# Patient Record
Sex: Female | Born: 1990 | Race: White | Hispanic: No | Marital: Single | Smoking: Current every day smoker
Health system: Southern US, Community
[De-identification: ages and names within clinical notes are randomized; demographics above are authoritative.]

## PROBLEM LIST (undated history)

## (undated) DIAGNOSIS — N390 Urinary tract infection, site not specified: Secondary | ICD-10-CM

## (undated) DIAGNOSIS — N200 Calculus of kidney: Secondary | ICD-10-CM

## (undated) DIAGNOSIS — I10 Essential (primary) hypertension: Secondary | ICD-10-CM

## (undated) HISTORY — PX: CHOLECYSTECTOMY: SHX55

## (undated) HISTORY — PX: LITHOTRIPSY: SUR834

---

## 2017-09-16 ENCOUNTER — Emergency Department
Admission: EM | Admit: 2017-09-16 | Discharge: 2017-09-16 | Disposition: A | Discharge status: Home / Self Care | Payer: Medicaid Other | Attending: Emergency Medicine | Admitting: Emergency Medicine

## 2017-09-16 ENCOUNTER — Emergency Department: Payer: Medicaid Other

## 2017-09-16 ENCOUNTER — Encounter: Payer: Self-pay | Admitting: Emergency Medicine

## 2017-09-16 DIAGNOSIS — I10 Essential (primary) hypertension: Secondary | ICD-10-CM | POA: Insufficient documentation

## 2017-09-16 DIAGNOSIS — B9789 Other viral agents as the cause of diseases classified elsewhere: Secondary | ICD-10-CM | POA: Insufficient documentation

## 2017-09-16 DIAGNOSIS — F17200 Nicotine dependence, unspecified, uncomplicated: Secondary | ICD-10-CM | POA: Diagnosis not present

## 2017-09-16 DIAGNOSIS — R05 Cough: Secondary | ICD-10-CM | POA: Diagnosis present

## 2017-09-16 DIAGNOSIS — J069 Acute upper respiratory infection, unspecified: Secondary | ICD-10-CM

## 2017-09-16 HISTORY — DX: Essential (primary) hypertension: I10

## 2017-09-16 MED ORDER — PSEUDOEPH-BROMPHEN-DM 30-2-10 MG/5ML PO SYRP: 5.0000 mL | ORAL_SOLUTION | Freq: Four times a day (QID) | ORAL | 0 refills | Status: DC | PRN

## 2017-09-16 NOTE — ED Provider Notes (Signed)
[facility] Emergency Department Provider Note   ____________________________________________   First MD Initiated Contact with Patient 09/16/17 1217     (approximate)  I have reviewed the triage vital signs and the nursing notes.   HISTORY  Chief Complaint Cough    HPI Mary Gilbert is a 26 y.o. female patient presents with productive cough for 3 weeks. Patient has fever with this complaint.There is nasal congestion which increases in the evening. Patient stated there is nausea but no vomiting. Patient states today she is nose bilateral ear pressure. Denies hearing loss. No palliative measures for complaint.   Past Medical History:  Diagnosis Date  . Hypertension     There are no active problems to display for this patient.   History reviewed. No pertinent surgical history.  Prior to Admission medications   Medication Sig Start Date End Date Taking? Authorizing Provider  lisdexamfetamine (VYVANSE) 20 MG capsule Take 20 mg by mouth daily.   Yes [provider]  brompheniramine-pseudoephedrine-DM 30-2-10 MG/5ML syrup Take 5 mLs by mouth 4 (four) times daily as needed. 09/16/17   Joni Reining, PA-C    Allergies Patient has no known allergies.  No family history on file.  Social History Social History  Substance Use Topics  . Smoking status: Current Every Day Smoker  . Smokeless tobacco: Never Used  . Alcohol use No    Review of Systems  Constitutional: No fever/chills Eyes: No visual changes. ENT: Nasal congestion and postnasal drainage  Cardiovascular: Denies chest pain. Respiratory: Duct a cough and shortness of breath. Gastrointestinal: No abdominal pain.  Nausea without vomiting.  No diarrhea.  No constipation. Genitourinary: Negative for dysuria. Musculoskeletal: Negative for back pain. Skin: Negative for rash. Neurological: Negative for headaches, focal weakness or  numbness. Psychiatric:ADHD Endocrine:Hypertension ____________________________________________   PHYSICAL EXAM:  VITAL SIGNS: ED Triage Vitals  Enc Vitals Group     BP 09/16/17 1211 124/82     Pulse Rate 09/16/17 1211 91     Resp 09/16/17 1211 20     Temp 09/16/17 1211 98.7 F (37.1 C)     Temp Source 09/16/17 1211 Oral     SpO2 09/16/17 1211 99 %     Weight 09/16/17 1210 260 lb (117.9 kg)     Height 09/16/17 1210  (1.651 m)     Head Circumference --      Peak Flow --      Pain Score --      Pain Loc --      Pain Edu? --      Excl. in GC? --     Constitutional: Alert and oriented. Well appearing and in no acute distress. Eyes: Conjunctivae are normal. PERRL. EOMI. Head: Atraumatic. Nose: Edematous nasal turbinates clear rhinorrhea Mouth/Throat: Mucous membranes are moist.  Oropharynx non-erythematous. Postnasal drainage Neck: No stridor.   Hematological/Lymphatic/Immunilogical: No cervical lymphadenopathy. Cardiovascular: Normal rate, regular rhythm. Grossly normal heart sounds.  Good peripheral circulation. Respiratory: Normal respiratory effort.  No retractions. Lungs CTAB. Gastrointestinal: Soft and nontender. No distention. No abdominal bruits. No CVA tenderness. Genitourinary: Deferred Musculoskeletal: No lower extremity tenderness nor edema.  No joint effusions. Neurologic:  Normal speech and language. No gross focal neurologic deficits are appreciated. No gait instability. Skin:  Skin is warm, dry and intact. No rash noted. Psychiatric: Mood and affect are normal. Speech and behavior are normal.  ____________________________________________   LABS (all labs ordered are listed, but only abnormal results are displayed)  Labs Reviewed -  No data to display ____________________________________________  EKG   ____________________________________________  RADIOLOGY  Dg Chest 2 View  Result Date: 09/16/2017 CLINICAL DATA:  Three-week history of  cough. EXAM: CHEST  2 VIEW COMPARISON:  None. FINDINGS: The heart size and mediastinal contours are within normal limits. Both lungs are clear. The visualized skeletal structures are unremarkable. IMPRESSION: No active cardiopulmonary disease. Electronically Signed   By: Kennith Center M.D.   On: 09/16/2017 12:51    __Acute findings on chest x-ray x-rays __________________________________________   PROCEDURES  Procedure(s) performed: None  Procedures  Critical Care performed: No  ____________________________________________   INITIAL IMPRESSION / ASSESSMENT AND PLAN / ED COURSE  Pertinent labs & imaging results that were available during my care of the patient were reviewed by me and considered in my medical decision making (see chart for details).  Cough secondary to upper respiratory infection. Discussed negative x-ray findings with patient. Patient given discharge care instructions. Patient advised to follow-up with the [facility] if condition persists.      ____________________________________________   FINAL CLINICAL IMPRESSION(S) / ED DIAGNOSES  Final diagnoses:  Viral URI with cough      NEW MEDICATIONS STARTED DURING THIS VISIT:  New Prescriptions   BROMPHENIRAMINE-PSEUDOEPHEDRINE-DM 30-2-10 MG/5ML SYRUP    Take 5 mLs by mouth 4 (four) times daily as needed.     Note:  This document was prepared using Dragon voice recognition software and may include unintentional dictation errors.    Joni Reining, PA-C 09/16/17 1311    Jene Every, MD 09/16/17 [phone]

## 2017-09-16 NOTE — ED Triage Notes (Signed)
presents with cough for about 3 weeks  Denies any fever  States she has had some nasal congestion with some diff breath  Esp at night

## 2017-10-02 ENCOUNTER — Emergency Department
Admission: EM | Admit: 2017-10-02 | Discharge: 2017-10-02 | Disposition: A | Discharge status: Home / Self Care | Payer: Medicaid Other | Attending: Emergency Medicine | Admitting: Emergency Medicine

## 2017-10-02 ENCOUNTER — Encounter: Payer: Self-pay | Admitting: Emergency Medicine

## 2017-10-02 ENCOUNTER — Emergency Department: Payer: Medicaid Other

## 2017-10-02 DIAGNOSIS — I10 Essential (primary) hypertension: Secondary | ICD-10-CM | POA: Diagnosis not present

## 2017-10-02 DIAGNOSIS — Y929 Unspecified place or not applicable: Secondary | ICD-10-CM | POA: Diagnosis not present

## 2017-10-02 DIAGNOSIS — S82892A Other fracture of left lower leg, initial encounter for closed fracture: Secondary | ICD-10-CM

## 2017-10-02 DIAGNOSIS — T148XXA Other injury of unspecified body region, initial encounter: Secondary | ICD-10-CM | POA: Diagnosis not present

## 2017-10-02 DIAGNOSIS — Y999 Unspecified external cause status: Secondary | ICD-10-CM | POA: Insufficient documentation

## 2017-10-02 DIAGNOSIS — F17210 Nicotine dependence, cigarettes, uncomplicated: Secondary | ICD-10-CM | POA: Insufficient documentation

## 2017-10-02 DIAGNOSIS — W19XXXA Unspecified fall, initial encounter: Secondary | ICD-10-CM | POA: Diagnosis not present

## 2017-10-02 DIAGNOSIS — S99912A Unspecified injury of left ankle, initial encounter: Secondary | ICD-10-CM | POA: Diagnosis present

## 2017-10-02 DIAGNOSIS — Y939 Activity, unspecified: Secondary | ICD-10-CM | POA: Diagnosis not present

## 2017-10-02 MED ORDER — TRAMADOL HCL 50 MG PO TABS: 50.0000 mg | ORAL_TABLET | Freq: Four times a day (QID) | ORAL | 0 refills | Status: DC | PRN

## 2017-10-02 MED ORDER — MELOXICAM 15 MG PO TABS: 15.0000 mg | ORAL_TABLET | Freq: Every day | ORAL | 2 refills | Status: DC

## 2017-10-02 NOTE — Discharge Instructions (Signed)
Keep foot and ankle elevated as much as possible.  Apply ice at least 3 times a day for 5 days.  Call orthopedics for appointment.

## 2017-10-02 NOTE — ED Triage Notes (Signed)
Twisted L ankle this am, painful.

## 2017-10-02 NOTE — ED Provider Notes (Signed)
[facility] Emergency Department Provider Note  ____________________________________________   First MD Initiated Contact with Patient 10/02/17 [phone]     (approximate)  I have reviewed the triage vital signs and the nursing notes.   HISTORY  Chief Complaint Ankle Pain    HPI Kerra Guilfoil is a 26 y.o. female complains of a fall this morning where she twisted her ankle. It hurts to bear weight and the ankle is swollen. Denies any numbness or tingling. denies any loss of motion. Patient also has abrasions on her arms and legs from the fall. Denies neck pain, loss of consciousness, abdominal pain or chest pain. States her tdap is utd.  Patient states she is also on lisinopril and another blood pressure medication along with a new birth control pill. Does not know the name of her medications.   Past Medical History:  Diagnosis Date  . Hypertension     There are no active problems to display for this patient.   History reviewed. No pertinent surgical history.  Prior to Admission medications   Medication Sig Start Date End Date Taking? Authorizing Provider  meloxicam (MOBIC) 15 MG tablet Take 1 tablet (15 mg total) by mouth daily. 10/02/17 10/02/18  Fisher, Roselyn Bering, PA-C  traMADol (ULTRAM) 50 MG tablet Take 1 tablet (50 mg total) by mouth every 6 (six) hours as needed. 10/02/17 10/02/18  Faythe Ghee, PA-C    Allergies Patient has no known allergies.  No family history on file.  Social History Social History  Substance Use Topics  . Smoking status: Current Every Day Smoker    Packs/day: 0.50    Types: Cigarettes  . Smokeless tobacco: Never Used  . Alcohol use No    Review of Systems  Constitutional: No fever/chills, no headache Eyes: No visual changes. ENT: No sore throat. No damage to teeth or face. Respiratory: Denies cough Gastrointestinal: Denies abdominal pain Musculoskeletal: Negative for back pain. Negative for neck pain positive  for left ankle pain Skin: Positive for abrasions    ____________________________________________   PHYSICAL EXAM:  VITAL SIGNS: ED Triage Vitals  Enc Vitals Group     BP 10/02/17 0847 122/73     Pulse Rate 10/02/17 0847 94     Resp 10/02/17 0847 20     Temp 10/02/17 0847 97.8 F (36.6 C)     Temp Source 10/02/17 0847 Oral     SpO2 10/02/17 0847 100 %     Weight 10/02/17 0849 270 lb (122.5 kg)     Height 10/02/17 0849 (!) 5" (0.127 m)     Head Circumference --      Peak Flow --      Pain Score 10/02/17 0846 10     Pain Loc --      Pain Edu? --      Excl. in GC? --    Constitutional: Alert and oriented. Well appearing and in no acute distress. Eyes: Conjunctivae are normal.  Head: Atraumatic. Nose: No congestion/rhinnorhea. Mouth/Throat: Mucous membranes are moist.   Cardiovascular: Normal rate, regular rhythm. Respiratory: Normal respiratory effort.  No retractions GU: deferred Musculoskeletal: FROM all extremities, warm and well perfused. Left ankle is swollen and tender at the lateral malleolus. No bruising noted. Foot has full range of motion. Ankle has full range of motion. Skin is intact across the ankle. Right elbow and bilateral wrist are negative for bony tenderness does have full range of motion. Neurologic:  Normal speech and language.  Skin:  Skin  is warm, dry and intact. Abrasions on the right elbow. Abrasions on the right hip. Psychiatric: Mood and affect are normal. Speech and behavior are normal.  ____________________________________________   LABS (all labs ordered are listed, but only abnormal results are displayed)  Labs Reviewed - No data to display ____________________________________________   ____________________________________________  RADIOLOGY Sliver fracture of the talus of the left ankle  ____________________________________________   PROCEDURES  Procedure(s)  performed:      ____________________________________________   INITIAL IMPRESSION / ASSESSMENT AND PLAN / ED COURSE  Pertinent labs & imaging results that were available during my care of the patient were reviewed by me and considered in my medical decision making (see chart for details).  Pt is 26 year old white female with left ankle pain. x-ray shows sliver fracture of the talus. Patient has multiple abrasions on the arms legs and knees. No other bony tenderness was noted.  Patient appears well and stable. Discussed x-ray with patient and her mother. Stirrup splint and crutches were ordered. Prescription for meloxicam and tramadol prescribed. Patient was instructed to elevate the ankle and keep ice on the area that is swollen. Was instructed to call orthopedics for a follow-up appointment. Patient states she understands and will call the orthopedic physician.      ____________________________________________   FINAL CLINICAL IMPRESSION(S) / ED DIAGNOSES  Final diagnoses:  Closed fracture of left ankle, initial encounter  Abrasion      NEW MEDICATIONS STARTED DURING THIS VISIT:  New Prescriptions   MELOXICAM (MOBIC) 15 MG TABLET    Take 1 tablet (15 mg total) by mouth daily.   TRAMADOL (ULTRAM) 50 MG TABLET    Take 1 tablet (50 mg total) by mouth every 6 (six) hours as needed.     Note:  This document was prepared using Dragon voice recognition software and may include unintentional dictation errors.    Faythe Ghee, PA-C 10/02/17 1033    Jene Every, MD 10/02/17 [phone]

## 2017-12-11 ENCOUNTER — Emergency Department
Admission: EM | Admit: 2017-12-11 | Discharge: 2017-12-11 | Disposition: A | Discharge status: Home / Self Care | Payer: Medicaid Other | Attending: Emergency Medicine | Admitting: Emergency Medicine

## 2017-12-11 ENCOUNTER — Encounter: Payer: Self-pay | Admitting: Emergency Medicine

## 2017-12-11 ENCOUNTER — Other Ambulatory Visit: Payer: Self-pay

## 2017-12-11 DIAGNOSIS — N611 Abscess of the breast and nipple: Secondary | ICD-10-CM | POA: Diagnosis not present

## 2017-12-11 DIAGNOSIS — F17210 Nicotine dependence, cigarettes, uncomplicated: Secondary | ICD-10-CM | POA: Insufficient documentation

## 2017-12-11 DIAGNOSIS — N6310 Unspecified lump in the right breast, unspecified quadrant: Secondary | ICD-10-CM | POA: Diagnosis present

## 2017-12-11 DIAGNOSIS — I10 Essential (primary) hypertension: Secondary | ICD-10-CM | POA: Insufficient documentation

## 2017-12-11 DIAGNOSIS — L0291 Cutaneous abscess, unspecified: Secondary | ICD-10-CM

## 2017-12-11 MED ORDER — MUPIROCIN CALCIUM 2 % EX CREA: 1.0000 "application " | TOPICAL_CREAM | Freq: Two times a day (BID) | CUTANEOUS | 0 refills | Status: DC

## 2017-12-11 MED ORDER — SULFAMETHOXAZOLE-TRIMETHOPRIM 800-160 MG PO TABS: 1.0000 | ORAL_TABLET | Freq: Two times a day (BID) | ORAL | 0 refills | Status: DC

## 2017-12-11 NOTE — ED Notes (Signed)
Pt ambulatory upon discharge. Verbalized understanding of discharge instructions, follow-up care and prescriptions. This RN also educated pt re: importance of taking entire course of antibiotics and to use back-up birth control while on antibiotics. A&O x4. Skin warm and dry. VSS.

## 2017-12-11 NOTE — Discharge Instructions (Signed)
Apply warm compress to the area, use medication as prescribed, you have been given an oral antibiotic and an ointment to apply to the area, if the redness is spreading and you develop a fever please return to the emergency department,  if you just or not improving in the next few days follow-up with your regular doctor

## 2017-12-11 NOTE — ED Triage Notes (Signed)
Possible insect bite R breast noted 2 weeks ago. Cont as small sore. Noted clear drainage today.

## 2017-12-11 NOTE — ED Provider Notes (Signed)
[facility] Emergency Department Provider Note  ____________________________________________   First MD Initiated Contact with Patient 12/11/17 2018     (approximate)  I have reviewed the triage vital signs and the nursing notes.   HISTORY  Chief Complaint Insect Bite    HPI Heath GoldHeather Cinnamon is a 26 y.o. female complains of a red swollen area to the right breast, states it has been there for couple of weeks, noticed that it started draining today, she denies fever chills, she denies any other problems at this time  Past Medical History:  Diagnosis Date  . Hypertension     There are no active problems to display for this patient.   History reviewed. No pertinent surgical history.  Prior to Admission medications   Medication Sig Start Date End Date Taking? Authorizing Provider  meloxicam (MOBIC) 15 MG tablet Take 1 tablet (15 mg total) by mouth daily. 10/02/17 10/02/18  Jaquavion Mccannon, Roselyn BeringSusan W, PA-C  mupirocin cream (BACTROBAN) 2 % Apply 1 application topically 2 (two) times daily. 12/11/17   Kaneisha Ellenberger, Roselyn BeringSusan W, PA-C  sulfamethoxazole-trimethoprim (BACTRIM DS,SEPTRA DS) 800-160 MG tablet Take 1 tablet by mouth 2 (two) times daily. 12/11/17   Faythe GheeFisher, Atara Paterson W, PA-C    Allergies Patient has no known allergies.  No family history on file.  Social History Social History   Tobacco Use  . Smoking status: Current Every Day Smoker    Packs/day: 0.50    Types: Cigarettes  . Smokeless tobacco: Never Used  Substance Use Topics  . Alcohol use: No  . Drug use: No    Review of Systems  Constitutional: No fever/chills Eyes: No visual changes. ENT: No sore throat. Respiratory: Denies cough Genitourinary: Negative for dysuria. Musculoskeletal: Negative for back pain. Skin: Positive for a red area on the right breast    ____________________________________________   PHYSICAL EXAM:  VITAL SIGNS: ED Triage Vitals  Enc Vitals Group     BP 12/11/17 1901  (!) 162/104     Pulse Rate 12/11/17 1901 93     Resp 12/11/17 1901 20     Temp 12/11/17 1901 98.2 F (36.8 C)     Temp Source 12/11/17 1901 Oral     SpO2 12/11/17 1901 98 %     Weight 12/11/17 1902 260 lb (117.9 kg)     Height 12/11/17 1902 5\' 7"  (1.702 m)     Head Circumference --      Peak Flow --      Pain Score --      Pain Loc --      Pain Edu? --      Excl. in GC? --     Constitutional: Alert and oriented. Well appearing and in no acute distress. Eyes: Conjunctivae are normal.  Head: Atraumatic. Nose: No congestion/rhinnorhea. Mouth/Throat: Mucous membranes are moist.   Cardiovascular: Normal rate, regular rhythm.  Heart sounds are normal Respiratory: Normal respiratory effort.  No retractions, lungs are clear to auscultation GU: deferred Musculoskeletal: FROM all extremities, warm and well perfused Neurologic:  Normal speech and language.  Skin:  Skin is warm, dry , small abscess that is already draining to the right breast above the nipple, the area is minimally tender, no induration noted, neurovascular is intact  psychiatric: Mood and affect are normal. Speech and behavior are normal.  ____________________________________________   LABS (all labs ordered are listed, but only abnormal results are displayed)  Labs Reviewed - No data to display ____________________________________________   ____________________________________________  RADIOLOGY  ____________________________________________   PROCEDURES  Procedure(s) performed: No      ____________________________________________   INITIAL IMPRESSION / ASSESSMENT AND PLAN / ED COURSE  Pertinent labs & imaging results that were available during my care of the patient were reviewed by me and considered in my medical decision making (see chart for details).  Patient is a 26 year old female with a next to the right breast that is already draining, the area is not indurated, a prescription for  Bactroban ointment and Septra DS 1 p.o. twice daily was given, patient is to apply warm compress to the area, she is to follow-up with her regular doctor if not better in 3-5 days, she is to return to the emergency department if worsening, she was discharged in stable condition      ____________________________________________   FINAL CLINICAL IMPRESSION(S) / ED DIAGNOSES  Final diagnoses:  Abscess      NEW MEDICATIONS STARTED DURING THIS VISIT:  This SmartLink is deprecated. Use AVSMEDLIST instead to display the medication list for a patient.   Note:  This document was prepared using Dragon voice recognition software and may include unintentional dictation errors.    Faythe GheeFisher, Hurley Sobel W, PA-C 12/11/17 2024    Jeanmarie PlantMcShane, James A, MD 12/11/17 [phone]

## 2017-12-27 ENCOUNTER — Other Ambulatory Visit: Payer: Self-pay

## 2017-12-27 ENCOUNTER — Emergency Department
Admission: EM | Admit: 2017-12-27 | Discharge: 2017-12-27 | Disposition: A | Discharge status: Home / Self Care | Payer: Medicaid Other | Attending: Emergency Medicine | Admitting: Emergency Medicine

## 2017-12-27 DIAGNOSIS — I10 Essential (primary) hypertension: Secondary | ICD-10-CM | POA: Diagnosis not present

## 2017-12-27 DIAGNOSIS — Z791 Long term (current) use of non-steroidal anti-inflammatories (NSAID): Secondary | ICD-10-CM | POA: Diagnosis not present

## 2017-12-27 DIAGNOSIS — F17210 Nicotine dependence, cigarettes, uncomplicated: Secondary | ICD-10-CM | POA: Diagnosis not present

## 2017-12-27 DIAGNOSIS — N760 Acute vaginitis: Secondary | ICD-10-CM | POA: Insufficient documentation

## 2017-12-27 DIAGNOSIS — N390 Urinary tract infection, site not specified: Secondary | ICD-10-CM | POA: Insufficient documentation

## 2017-12-27 DIAGNOSIS — R102 Pelvic and perineal pain: Secondary | ICD-10-CM | POA: Diagnosis present

## 2017-12-27 DIAGNOSIS — B9689 Other specified bacterial agents as the cause of diseases classified elsewhere: Secondary | ICD-10-CM

## 2017-12-27 HISTORY — DX: Urinary tract infection, site not specified: N39.0

## 2017-12-27 HISTORY — DX: Calculus of kidney: N20.0

## 2017-12-27 LAB — URINALYSIS, COMPLETE (UACMP) WITH MICROSCOPIC
Bacteria, UA: NONE SEEN
Bilirubin Urine: NEGATIVE
GLUCOSE, UA: NEGATIVE mg/dL
Hgb urine dipstick: NEGATIVE
KETONES UR: NEGATIVE mg/dL
NITRITE: NEGATIVE
PH: 6 (ref 5.0–8.0)
Protein, ur: NEGATIVE mg/dL
Specific Gravity, Urine: 1.02 (ref 1.005–1.030)

## 2017-12-27 LAB — CHLAMYDIA/NGC RT PCR (ARMC ONLY)
Chlamydia Tr: NOT DETECTED
N GONORRHOEAE: NOT DETECTED

## 2017-12-27 LAB — WET PREP, GENITAL
CLUE CELLS WET PREP: NONE SEEN
SPERM: NONE SEEN
Trich, Wet Prep: NONE SEEN
Yeast Wet Prep HPF POC: NONE SEEN

## 2017-12-27 MED ORDER — IBUPROFEN 600 MG PO TABS
600.0000 mg | ORAL_TABLET | Freq: Once | ORAL | Status: AC
  Administered 2017-12-27: 600 mg via ORAL
  Filled 2017-12-27: qty 1

## 2017-12-27 MED ORDER — METRONIDAZOLE 500 MG PO TABS: 500.0000 mg | ORAL_TABLET | Freq: Two times a day (BID) | ORAL | 0 refills | Status: AC

## 2017-12-27 MED ORDER — CIPROFLOXACIN HCL 500 MG PO TABS: 500.0000 mg | ORAL_TABLET | Freq: Two times a day (BID) | ORAL | 0 refills | Status: AC

## 2017-12-27 NOTE — ED Provider Notes (Signed)
[facility] Emergency Department Provider Note ____________________________________________   First MD Initiated Contact with Patient 12/27/17 [phone]     (approximate)  I have reviewed the triage vital signs and the nursing notes.   HISTORY  Chief Complaint Pelvic Pain    HPI Mary Gilbert is a 26 y.o. female with past medical history as noted below who presents with vaginal pain for the last several days, gradual onset, associated with brownish vaginal discharge, and similar to symptoms she has had in the past associated with bacterial vaginosis.  The patient states she is sexually active with one partner.  She denies vaginal bleeding, urinary symptoms, back pain, abdominal pain, or fever or chills.   Past Medical History:  Diagnosis Date  . Hypertension   . Kidney stones   . UTI (urinary tract infection)     There are no active problems to display for this patient.   Past Surgical History:  Procedure Laterality Date  . CHOLECYSTECTOMY    . LITHOTRIPSY      Prior to Admission medications   Medication Sig Start Date End Date Taking? Authorizing Provider  ciprofloxacin (CIPRO) 500 MG tablet Take 1 tablet (500 mg total) by mouth 2 (two) times daily for 3 days. 12/27/17 12/30/17  Dionne BucySiadecki, Brodyn Depuy, MD  meloxicam (MOBIC) 15 MG tablet Take 1 tablet (15 mg total) by mouth daily. 10/02/17 10/02/18  Fisher, Roselyn BeringSusan W, PA-C  metroNIDAZOLE (FLAGYL) 500 MG tablet Take 1 tablet (500 mg total) by mouth 2 (two) times daily for 7 days. 12/27/17 01/03/18  Dionne BucySiadecki, Kahlea Cobert, MD  mupirocin cream (BACTROBAN) 2 % Apply 1 application topically 2 (two) times daily. 12/11/17   Fisher, Roselyn BeringSusan W, PA-C  sulfamethoxazole-trimethoprim (BACTRIM DS,SEPTRA DS) 800-160 MG tablet Take 1 tablet by mouth 2 (two) times daily. 12/11/17   Faythe GheeFisher, Susan W, PA-C    Allergies Patient has no known allergies.  No family history on file.  Social History Social History   Tobacco Use  .  Smoking status: Current Every Day Smoker    Packs/day: 0.50    Types: Cigarettes  . Smokeless tobacco: Never Used  Substance Use Topics  . Alcohol use: No  . Drug use: No    Review of Systems  Constitutional: No fever/chills. Eyes: No redness. ENT: No sore throat. Cardiovascular: Denies chest pain. Respiratory: Denies shortness of breath. Gastrointestinal: No nausea, no vomiting.  No diarrhea.  Genitourinary: Negative for dysuria.  Musculoskeletal: Negative for back pain. Skin: Negative for rash. Neurological: Negative for headache.   ____________________________________________   PHYSICAL EXAM:  VITAL SIGNS: ED Triage Vitals  Enc Vitals Group     BP 12/27/17 0831 135/80     Pulse Rate 12/27/17 0831 86     Resp 12/27/17 0831 17     Temp 12/27/17 0831 97.8 F (36.6 C)     Temp Source 12/27/17 0831 Oral     SpO2 12/27/17 0831 98 %     Weight 12/27/17 0832 260 lb (117.9 kg)     Height 12/27/17 0832 5\' 7"  (1.702 m)     Head Circumference --      Peak Flow --      Pain Score 12/27/17 0831 5     Pain Loc --      Pain Edu? --      Excl. in GC? --     Constitutional: Alert and oriented. Well appearing and in no acute distress. Eyes: Conjunctivae are normal.  Head: Atraumatic. Nose: No congestion/rhinnorhea. Mouth/Throat: Mucous  membranes are moist.   Neck: Normal range of motion.  Cardiovascular: Good peripheral circulation. Respiratory: Normal respiratory effort.   Gastrointestinal: Soft and nontender. No distention.  Genitourinary: No flank tenderness.  Normal external genitalia.  Small amount of green/brown vaginal discharge in the vault.  No CMT or adnexal tenderness. Musculoskeletal: Extremities warm and well perfused.  Neurologic:  Normal speech and language. No gross focal neurologic deficits are appreciated.  Skin:  Skin is warm and dry. No rash noted. Psychiatric: Mood and affect are normal. Speech and behavior are  normal.  ____________________________________________   LABS (all labs ordered are listed, but only abnormal results are displayed)  Labs Reviewed  WET PREP, GENITAL - Abnormal; Notable for the following components:      Result Value   WBC, Wet Prep HPF POC FEW (*)    All other components within normal limits  URINALYSIS, COMPLETE (UACMP) WITH MICROSCOPIC - Abnormal; Notable for the following components:   Color, Urine YELLOW (*)    APPearance CLOUDY (*)    Leukocytes, UA MODERATE (*)    Squamous Epithelial / LPF 0-5 (*)    All other components within normal limits  CHLAMYDIA/NGC RT PCR ([facility] ONLY)   ____________________________________________  EKG   ____________________________________________  RADIOLOGY    ____________________________________________   PROCEDURES  Procedure(s) performed: No    Critical Care performed: No ____________________________________________   INITIAL IMPRESSION / ASSESSMENT AND PLAN / ED COURSE  Pertinent labs & imaging results that were available during my care of the patient were reviewed by me and considered in my medical decision making (see chart for details).  26 year old female with past medical history as noted above presents with several days of vaginal area pain and discharge, with no bleeding or dysuria.  No fevers or abdominal pain.  Past medical records reviewed in epic and are noncontributory.  On exam, patient is well-appearing, vital signs are normal, and the remainder of the exam is as described above with small amount of vaginal discharge but no other significant findings on pelvic.  Differential includes BV, yeast, UTI, or other nonemergent causes of pelvic pain such as ovarian cyst or mittelschmerz.  There is no clinical evidence for PID.  I will treat based on results of UA and wet prep.    ----------------------------------------- 10:25 AM on 12/27/2017 -----------------------------------------  UA  is consistent with possible UTI, but given the presence of the discharge I will also treat for BV.  I will give a 3-day course of Cipro and a 7-day course of Flagyl to treat both these.  Return precautions and discharge instructions explained to patient, and she expresses understanding.  She states she does not have a doctor here and does not immediately plan to get one as she is not possibly staying in the area, and [place] in [place].  She was instructed to return to the emergency department for new, worsening, or persistent symptoms.  ____________________________________________   FINAL CLINICAL IMPRESSION(S) / ED DIAGNOSES  Final diagnoses:  Urinary tract infection without hematuria, site unspecified  Bacterial vaginitis      NEW MEDICATIONS STARTED DURING THIS VISIT:  This SmartLink is deprecated. Use AVSMEDLIST instead to display the medication list for a patient.   Note:  This document was prepared using Dragon voice recognition software and may include unintentional dictation errors.    Dionne BucySiadecki, Rylynn Kobs, MD 12/27/17 1026

## 2017-12-27 NOTE — Discharge Instructions (Signed)
Take both antibiotics as prescribed and finish the full course.  Return to the emergency department for new, worsening, or persistent pain, discharge, bleeding, back or flank pain, weakness, fever chills, or any other new or worsening symptoms that concern you.

## 2017-12-27 NOTE — ED Triage Notes (Signed)
Pt c/o pelvic pain with painful urination and vaginal discharge since Friday/. Pt states she has a hx of bacterial vaginosis..Marland Kitchen

## 2018-01-07 ENCOUNTER — Other Ambulatory Visit: Payer: Self-pay

## 2018-01-07 ENCOUNTER — Emergency Department
Admission: EM | Admit: 2018-01-07 | Discharge: 2018-01-07 | Disposition: A | Discharge status: Home / Self Care | Payer: Medicaid Other | Attending: Emergency Medicine | Admitting: Emergency Medicine

## 2018-01-07 DIAGNOSIS — F17210 Nicotine dependence, cigarettes, uncomplicated: Secondary | ICD-10-CM | POA: Insufficient documentation

## 2018-01-07 DIAGNOSIS — J029 Acute pharyngitis, unspecified: Secondary | ICD-10-CM

## 2018-01-07 DIAGNOSIS — I10 Essential (primary) hypertension: Secondary | ICD-10-CM | POA: Insufficient documentation

## 2018-01-07 LAB — GROUP A STREP BY PCR: GROUP A STREP BY PCR: NOT DETECTED

## 2018-01-07 MED ORDER — PSEUDOEPH-BROMPHEN-DM 30-2-10 MG/5ML PO SYRP: 5.0000 mL | ORAL_SOLUTION | Freq: Four times a day (QID) | ORAL | 0 refills | Status: DC | PRN

## 2018-01-07 NOTE — Discharge Instructions (Signed)
Follow-up with your primary care doctor in [place] if any continued problems.  Tylenol or ibuprofen as needed for throat pain, bodyaches or fever.  Increase fluids.  Take Bromfed-DM as needed for cough and congestion.

## 2018-01-07 NOTE — ED Provider Notes (Signed)
[facility] Emergency Department Provider Note __________________________________________   First MD Initiated Contact with Patient 01/07/18 [phone]     (approximate)  I have reviewed the triage vital signs and the nursing notes.   HISTORY  Chief Complaint Sore Throat  HPI Mary Gilbert is a 27 y.o. female is here with complaint of sore throat and chills since yesterday.  Patient has not actually taken her temperature but has felt warm.  She has taken over-the-counter medication given to her by her mother.  She is here visiting from [place].  Patient has a infrequent cough.  Denies any vomiting or diarrhea.  She rates her pain is 7 out of 10.  Past Medical History:  Diagnosis Date  . Hypertension   . Kidney stones   . UTI (urinary tract infection)     There are no active problems to display for this patient.   Past Surgical History:  Procedure Laterality Date  . CHOLECYSTECTOMY    . LITHOTRIPSY      Prior to Admission medications   Medication Sig Start Date End Date Taking? Authorizing Provider  brompheniramine-pseudoephedrine-DM 30-2-10 MG/5ML syrup Take 5 mLs by mouth 4 (four) times daily as needed. 01/07/18   Tommi RumpsSummers, Rhonda L, PA-C    Allergies Patient has no known allergies.  No family history on file.  Social History Social History   Tobacco Use  . Smoking status: Current Every Day Smoker    Packs/day: 0.50    Types: Cigarettes  . Smokeless tobacco: Never Used  Substance Use Topics  . Alcohol use: No  . Drug use: No    Review of Systems Constitutional: No fever/chills Eyes: No visual changes. ENT: Positive sore throat.  No ear pain.  Positive throat pain. Cardiovascular: Denies chest pain. Respiratory: Denies shortness of breath.  Nonproductive cough. Gastrointestinal: No abdominal pain.  No nausea, no vomiting.  No diarrhea.  Skin: Negative for rash. Neurological: Negative for headaches, focal weakness or  numbness. __________________________________________   PHYSICAL EXAM:  VITAL SIGNS: ED Triage Vitals  Enc Vitals Group     BP 01/07/18 0653 127/83     Pulse Rate 01/07/18 0653 (!) 120     Resp 01/07/18 0653 20     Temp 01/07/18 0653 98.4 F (36.9 C)     Temp Source 01/07/18 0653 Oral     SpO2 01/07/18 0653 97 %     Weight 01/07/18 0652 266 lb (120.7 kg)     Height 01/07/18 0652 5\' 7"  (1.702 m)     Head Circumference --      Peak Flow --      Pain Score 01/07/18 0652 7     Pain Loc --      Pain Edu? --      Excl. in GC? --    Constitutional: Alert and oriented. Well appearing and in no acute distress. Eyes: Conjunctivae are normal.  Head: Atraumatic. Nose: Minimal congestion/rhinnorhea. Mouth/Throat: Mucous membranes are moist.  Oropharynx non-erythematous.  Posterior drainage present. Neck: No stridor.   Hematological/Lymphatic/Immunilogical: No cervical lymphadenopathy. Cardiovascular: Normal rate, regular rhythm. Grossly normal heart sounds.  Good peripheral circulation. Respiratory: Normal respiratory effort.  No retractions. Lungs CTAB. Musculoskeletal: Moves upper and lower extremities without any difficulty.  Normal gait was noted. Neurologic:  Normal speech and language. No gross focal neurologic deficits are appreciated.  Skin:  Skin is warm, dry and intact. No rash noted. Psychiatric: Mood and affect are normal. Speech and behavior are normal.  ____________________________________________  LABS (all labs ordered are listed, but only abnormal results are displayed)  Labs Reviewed  GROUP A STREP BY PCR    PROCEDURES  Procedure(s) performed: None  Procedures  Critical Care performed: No  ____________________________________________   INITIAL IMPRESSION / ASSESSMENT AND PLAN / ED COURSE Patient was given prescription for Bromfed-DM 1 teaspoon 4 times daily as needed for cough and congestion.  She is to increase fluids.  Tylenol or ibuprofen as needed  for throat pain or fever.  She will follow-up with her PCP in [place] if any continued problems. ____________________________________________   FINAL CLINICAL IMPRESSION(S) / ED DIAGNOSES  Final diagnoses:  Viral pharyngitis     ED Discharge Orders        Ordered    brompheniramine-pseudoephedrine-DM 30-2-10 MG/5ML syrup  4 times daily PRN     01/07/18 0752       Note:  This document was prepared using Dragon voice recognition software and may include unintentional dictation errors.   Tommi Rumps, PA-C 01/07/18 1610    Nita Sickle, MD 01/07/18 1452

## 2018-01-07 NOTE — ED Notes (Signed)
See triage note  Presents with a 2 day hx of sore throat and fever  States fever at home was 100.5  Increased pain with swallowing

## 2018-01-07 NOTE — ED Triage Notes (Signed)
Sore throat since yest with chills.

## 2018-05-09 ENCOUNTER — Emergency Department: Payer: No Typology Code available for payment source

## 2018-05-09 ENCOUNTER — Encounter: Payer: Self-pay | Admitting: Emergency Medicine

## 2018-05-09 ENCOUNTER — Other Ambulatory Visit: Payer: Self-pay

## 2018-05-09 ENCOUNTER — Emergency Department
Admission: EM | Admit: 2018-05-09 | Discharge: 2018-05-09 | Disposition: A | Discharge status: Home / Self Care | Payer: No Typology Code available for payment source | Attending: Emergency Medicine | Admitting: Emergency Medicine

## 2018-05-09 DIAGNOSIS — I10 Essential (primary) hypertension: Secondary | ICD-10-CM | POA: Diagnosis not present

## 2018-05-09 DIAGNOSIS — F17210 Nicotine dependence, cigarettes, uncomplicated: Secondary | ICD-10-CM | POA: Insufficient documentation

## 2018-05-09 DIAGNOSIS — Z79899 Other long term (current) drug therapy: Secondary | ICD-10-CM | POA: Insufficient documentation

## 2018-05-09 DIAGNOSIS — M542 Cervicalgia: Secondary | ICD-10-CM | POA: Insufficient documentation

## 2018-05-09 DIAGNOSIS — Y939 Activity, unspecified: Secondary | ICD-10-CM | POA: Insufficient documentation

## 2018-05-09 DIAGNOSIS — Y999 Unspecified external cause status: Secondary | ICD-10-CM | POA: Insufficient documentation

## 2018-05-09 DIAGNOSIS — M545 Low back pain: Secondary | ICD-10-CM | POA: Diagnosis not present

## 2018-05-09 DIAGNOSIS — Y92410 Unspecified street and highway as the place of occurrence of the external cause: Secondary | ICD-10-CM | POA: Diagnosis not present

## 2018-05-09 MED ORDER — MELOXICAM 15 MG PO TABS: 15.0000 mg | ORAL_TABLET | Freq: Every day | ORAL | 1 refills | Status: AC

## 2018-05-09 MED ORDER — CYCLOBENZAPRINE HCL 5 MG PO TABS: 5.0000 mg | ORAL_TABLET | Freq: Three times a day (TID) | ORAL | 0 refills | Status: AC | PRN

## 2018-05-09 NOTE — ED Notes (Signed)
Pt states she is unable to urinate, willing to sign for radiology that she is not pregnant.  Radiology notified.
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
## 2018-05-11 ENCOUNTER — Encounter: Payer: Self-pay | Admitting: Emergency Medicine

## 2018-05-11 ENCOUNTER — Other Ambulatory Visit: Payer: Self-pay

## 2018-05-11 ENCOUNTER — Emergency Department: Payer: No Typology Code available for payment source

## 2018-05-11 ENCOUNTER — Emergency Department
Admission: EM | Admit: 2018-05-11 | Discharge: 2018-05-11 | Disposition: A | Discharge status: Home / Self Care | Payer: No Typology Code available for payment source | Attending: Emergency Medicine | Admitting: Emergency Medicine

## 2018-05-11 DIAGNOSIS — R51 Headache: Secondary | ICD-10-CM | POA: Insufficient documentation

## 2018-05-11 DIAGNOSIS — Y999 Unspecified external cause status: Secondary | ICD-10-CM | POA: Insufficient documentation

## 2018-05-11 DIAGNOSIS — F17210 Nicotine dependence, cigarettes, uncomplicated: Secondary | ICD-10-CM | POA: Insufficient documentation

## 2018-05-11 DIAGNOSIS — S161XXA Strain of muscle, fascia and tendon at neck level, initial encounter: Secondary | ICD-10-CM | POA: Diagnosis not present

## 2018-05-11 DIAGNOSIS — Y939 Activity, unspecified: Secondary | ICD-10-CM | POA: Insufficient documentation

## 2018-05-11 DIAGNOSIS — S199XXA Unspecified injury of neck, initial encounter: Secondary | ICD-10-CM | POA: Diagnosis present

## 2018-05-11 DIAGNOSIS — Y929 Unspecified place or not applicable: Secondary | ICD-10-CM | POA: Insufficient documentation

## 2018-05-11 DIAGNOSIS — I10 Essential (primary) hypertension: Secondary | ICD-10-CM | POA: Insufficient documentation

## 2018-05-11 MED ORDER — OXYCODONE-ACETAMINOPHEN 5-325 MG PO TABS
1.0000 | ORAL_TABLET | Freq: Once | ORAL | Status: AC
  Administered 2018-05-11: 1 via ORAL
  Filled 2018-05-11: qty 1

## 2018-05-11 MED ORDER — KETOROLAC TROMETHAMINE 10 MG PO TABS: 10.0000 mg | ORAL_TABLET | Freq: Four times a day (QID) | ORAL | 0 refills | Status: DC | PRN

## 2018-05-11 MED ORDER — KETOROLAC TROMETHAMINE 30 MG/ML IJ SOLN
30.0000 mg | Freq: Once | INTRAMUSCULAR | Status: AC
  Administered 2018-05-11: 30 mg via INTRAMUSCULAR
  Filled 2018-05-11: qty 1

## 2018-05-11 NOTE — Discharge Instructions (Addendum)
Begin Flexeril and discontinue Mobic
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
## 2018-07-25 ENCOUNTER — Ambulatory Visit
Admission: EM | Admit: 2018-07-25 | Discharge: 2018-07-25 | Disposition: A | Discharge status: Home / Self Care | Payer: Self-pay | Attending: Family Medicine | Admitting: Family Medicine

## 2018-07-25 ENCOUNTER — Other Ambulatory Visit: Payer: Self-pay

## 2018-07-25 DIAGNOSIS — H6983 Other specified disorders of Eustachian tube, bilateral: Secondary | ICD-10-CM

## 2018-07-25 DIAGNOSIS — A599 Trichomoniasis, unspecified: Secondary | ICD-10-CM

## 2018-07-25 DIAGNOSIS — N898 Other specified noninflammatory disorders of vagina: Secondary | ICD-10-CM

## 2018-07-25 DIAGNOSIS — R300 Dysuria: Secondary | ICD-10-CM

## 2018-07-25 LAB — URINALYSIS, COMPLETE (UACMP) WITH MICROSCOPIC
BILIRUBIN URINE: NEGATIVE
Bacteria, UA: NONE SEEN
Glucose, UA: NEGATIVE mg/dL
HGB URINE DIPSTICK: NEGATIVE
Ketones, ur: NEGATIVE mg/dL
Leukocytes, UA: NEGATIVE
NITRITE: NEGATIVE
Protein, ur: NEGATIVE mg/dL
RBC / HPF: NONE SEEN RBC/hpf (ref 0–5)
Specific Gravity, Urine: >1.03 — ABNORMAL HIGH (ref 1.005–1.030)
pH: 5.5 (ref 5.0–8.0)

## 2018-07-25 LAB — WET PREP, GENITAL
Clue Cells Wet Prep HPF POC: NONE SEEN
SPERM: NONE SEEN
YEAST WET PREP: NONE SEEN

## 2018-07-25 LAB — CHLAMYDIA/NGC RT PCR (ARMC ONLY)
Chlamydia Tr: NOT DETECTED
N gonorrhoeae: NOT DETECTED

## 2018-07-25 MED ORDER — FLUTICASONE PROPIONATE 50 MCG/ACT NA SUSP: 2.0000 | Freq: Every day | NASAL | 0 refills | Status: DC

## 2018-07-25 MED ORDER — METRONIDAZOLE 500 MG PO TABS: 2000.0000 mg | ORAL_TABLET | Freq: Once | ORAL | 0 refills | Status: AC

## 2018-07-25 NOTE — ED Triage Notes (Signed)
Patient complains of bilateral ear pain, worse in left than right x January.   Patient also complains of urinary frequency x 2 months. Patient states that she has tried AZO without relief. Patient states that she gets BV often and thinks this may be the cause.   STD screening. Patient states that she would like to be checked for STD. No known exposure.

## 2018-07-25 NOTE — ED Provider Notes (Signed)
[facility]    CSN: [id number] Arrival date & time: 07/25/18  1849  History   Chief Complaint Chief Complaint  Patient presents with  . Otalgia  . Urinary Frequency   HPI   27 year old female presents with issues below.  Bilateral ear pain  Patient reports bilateral ear pain, left greater than right.  Patient states that she has had ear pain since January.  No medications or interventions tried.  Describes the pain as feeling as if there is air going into her ear.  No known exacerbating or relieving factors.  No hearing loss.  No drainage from the ear.  Vaginal odor  Patient reports ongoing vaginal odor, dysuria, and mild vaginal pain.  Patient states that this is been going on for the past 2 months.  Patient believes that she may have bacterial vaginosis as she has had this in the past.  No fevers or chills.  She is currently sexually active.  Patient states that she would like STD testing today.  No reported exposures.  No other associated symptoms.  No other complaints.   Past Medical History:  Diagnosis Date  . Hypertension   . Kidney stones   . UTI (urinary tract infection)    Past Surgical History:  Procedure Laterality Date  . CHOLECYSTECTOMY    . LITHOTRIPSY      OB History   None    Home Medications    Prior to Admission medications   Medication Sig Start Date End Date Taking? Authorizing Provider  fluticasone (FLONASE) 50 MCG/ACT nasal spray Place 2 sprays into both nostrils daily. 07/25/18   Tommie Samsook, Makaelah Cranfield G, DO  metroNIDAZOLE (FLAGYL) 500 MG tablet Take 4 tablets (2,000 mg total) by mouth once for 1 dose. 07/25/18 07/25/18  Tommie Samsook, Taelyr Jantz G, DO    Family History Family History  Problem Relation Age of Onset  . Mental illness Mother   . Heart disease Father   . Hypertension Father     Social History Social History   Tobacco Use  . Smoking status: Current Every Day Smoker    Packs/day: 1.00    Types: Cigarettes  .  Smokeless tobacco: Never Used  Substance Use Topics  . Alcohol use: No  . Drug use: No     Allergies   Patient has no known allergies.   Review of Systems Review of Systems  Constitutional: Negative.   HENT: Positive for ear pain.   Genitourinary: Positive for dysuria.       Vaginal odor.   Physical Exam Triage Vital Signs ED Triage Vitals  Enc Vitals Group     BP 07/25/18 1905 (!) 158/104     Pulse Rate 07/25/18 1905 94     Resp 07/25/18 1905 18     Temp 07/25/18 1905 98.5 F (36.9 C)     Temp Source 07/25/18 1905 Oral     SpO2 07/25/18 1905 100 %     Weight 07/25/18 1902 265 lb (120.2 kg)     Height 07/25/18 1902 5\' 6"  (1.676 m)     Head Circumference --      Peak Flow --      Pain Score 07/25/18 1902 0     Pain Loc --      Pain Edu? --      Excl. in GC? --    Updated Vital Signs BP (!) 158/104 (BP Location: Right Arm)   Pulse 94   Temp 98.5 F (36.9 C) (Oral)   Resp 18  Ht 5\' 6"  (1.676 m)   Wt 265 lb (120.2 kg)   LMP 07/08/2018   SpO2 100%   BMI 42.77 kg/m   Visual Acuity Right Eye Distance:   Left Eye Distance:   Bilateral Distance:    Right Eye Near:   Left Eye Near:    Bilateral Near:     Physical Exam  Constitutional: She is oriented to person, place, and time. She appears well-developed. No distress.  HENT:  Head: Normocephalic and atraumatic.  Mouth/Throat: Oropharynx is clear and moist.  Normal TM's.   Cardiovascular: Normal rate and regular rhythm.  Pulmonary/Chest: Effort normal and breath sounds normal. She has no wheezes. She has no rales.  Neurological: She is alert and oriented to person, place, and time.  Psychiatric: She has a normal mood and affect. Her behavior is normal.  Nursing note and vitals reviewed.  UC Treatments / Results  Labs (all labs ordered are listed, but only abnormal results are displayed) Labs Reviewed  WET PREP, GENITAL - Abnormal; Notable for the following components:      Result Value   Trich, Wet  Prep PRESENT (*)    WBC, Wet Prep HPF POC MODERATE (*)    All other components within normal limits  URINALYSIS, COMPLETE (UACMP) WITH MICROSCOPIC - Abnormal; Notable for the following components:   Specific Gravity, Urine >1.030 (*)    All other components within normal limits  CHLAMYDIA/NGC RT PCR ([facility] ONLY)  HIV ANTIBODY (ROUTINE TESTING)  RPR    EKG None  Radiology No results found.  Procedures Procedures (including critical care time)  Medications Ordered in UC Medications - No data to display  Initial Impression / Assessment and Plan / UC Course  I have reviewed the triage vital signs and the nursing notes.  Pertinent labs & imaging results that were available during my care of the patient were reviewed by me and considered in my medical decision making (see chart for details).    27 year old female presents with eustachian tube dysfunction.  Patient also presents with dysuria and vaginal odor.  Wet prep obtained and revealed trichomonas.  Treating with Flagyl.  Awaiting GC and chlamydia as well as HIV and RPR.  Final Clinical Impressions(s) / UC Diagnoses   Final diagnoses:  Trichomonas infection  Dysfunction of both eustachian tubes     Discharge Instructions     We will call with the remainder of the results.  No evidence of UTI.  Flagyl as prescribed.  Flonase to help eustachian tube dysfunction. If ear pain persists, see ENT.  Take care  Dr. Adriana Simas     ED Prescriptions    Medication Sig Dispense Auth. Provider   fluticasone (FLONASE) 50 MCG/ACT nasal spray Place 2 sprays into both nostrils daily. 16 g Tenzin Edelman G, DO   metroNIDAZOLE (FLAGYL) 500 MG tablet Take 4 tablets (2,000 mg total) by mouth once for 1 dose. 4 tablet Tommie Sams, DO     Controlled Substance Prescriptions [place] Controlled Substance Registry consulted? Not Applicable   Tommie Sams, DO 07/25/18 2011

## 2018-07-25 NOTE — Discharge Instructions (Signed)
We will call with the remainder of the results.  No evidence of UTI.  Flagyl as prescribed.  Flonase to help eustachian tube dysfunction. If ear pain persists, see ENT.  Take care  Dr. Adriana Simasook

## 2018-07-27 LAB — HIV ANTIBODY (ROUTINE TESTING W REFLEX): HIV SCREEN 4TH GENERATION: NONREACTIVE

## 2018-07-27 LAB — RPR: RPR: NONREACTIVE

## 2018-08-16 ENCOUNTER — Ambulatory Visit
Admission: EM | Admit: 2018-08-16 | Discharge: 2018-08-16 | Disposition: A | Discharge status: Home / Self Care | Payer: Self-pay | Attending: Family Medicine | Admitting: Family Medicine

## 2018-08-16 ENCOUNTER — Other Ambulatory Visit: Payer: Self-pay

## 2018-08-16 ENCOUNTER — Encounter: Payer: Self-pay | Admitting: Emergency Medicine

## 2018-08-16 DIAGNOSIS — B3731 Acute candidiasis of vulva and vagina: Secondary | ICD-10-CM

## 2018-08-16 DIAGNOSIS — B373 Candidiasis of vulva and vagina: Secondary | ICD-10-CM

## 2018-08-16 LAB — URINALYSIS, COMPLETE (UACMP) WITH MICROSCOPIC
BILIRUBIN URINE: NEGATIVE
Bacteria, UA: NONE SEEN
GLUCOSE, UA: NEGATIVE mg/dL
Hgb urine dipstick: NEGATIVE
Ketones, ur: NEGATIVE mg/dL
LEUKOCYTES UA: NEGATIVE
Nitrite: NEGATIVE
WBC, UA: NONE SEEN WBC/hpf (ref 0–5)
pH: 5 (ref 5.0–8.0)

## 2018-08-16 LAB — WET PREP, GENITAL
Clue Cells Wet Prep HPF POC: NONE SEEN
SPERM: NONE SEEN
Trich, Wet Prep: NONE SEEN

## 2018-08-16 MED ORDER — FLUCONAZOLE 150 MG PO TABS: 150.0000 mg | ORAL_TABLET | Freq: Once | ORAL | 1 refills | Status: AC

## 2018-08-16 MED ORDER — FLUCONAZOLE 150 MG PO TABS: 150.0000 mg | ORAL_TABLET | Freq: Once | ORAL | 1 refills | Status: DC

## 2018-08-16 NOTE — ED Provider Notes (Signed)
[facility]    CSN: [id number] Arrival date & time: 08/16/18  1147  History   Chief Complaint Chief Complaint  Patient presents with  . Vaginal Discharge   HPI  27 year old female presents with vaginal discharge.  Patient was recently seen and treated for trichomonas.  Patient states that she took the antibiotics as prescribed.  Patient states that she continues to have vaginal discharge.  She now has itching.  Discharge is different from prior as it is thick and white.  Additionally, she states that she is now having vaginal itching which she did not have before.  No fevers or chills.  Mild dysuria.  Likely brought on by the antibiotic.  No known relieving factors.  No other complaints.  Past Medical History:  Diagnosis Date  . Hypertension   . Kidney stones   . UTI (urinary tract infection)    Past Surgical History:  Procedure Laterality Date  . CHOLECYSTECTOMY    . LITHOTRIPSY      OB History   None      Home Medications    Prior to Admission medications   Medication Sig Start Date End Date Taking? Authorizing Provider  fluticasone (FLONASE) 50 MCG/ACT nasal spray Place 2 sprays into both nostrils daily. 07/25/18  Yes Larnie Heart G, DO  fluconazole (DIFLUCAN) 150 MG tablet Take 1 tablet (150 mg total) by mouth once for 1 dose. Additional dose in 72 hours. 08/16/18 08/16/18  Tommie Samsook, Ray Gervasi G, DO    Family History Family History  Problem Relation Age of Onset  . Mental illness Mother   . Heart disease Father   . Hypertension Father     Social History Social History   Tobacco Use  . Smoking status: Current Every Day Smoker    Packs/day: 1.00    Types: Cigarettes  . Smokeless tobacco: Never Used  Substance Use Topics  . Alcohol use: No  . Drug use: No     Allergies   Patient has no known allergies.   Review of Systems Review of Systems  Constitutional: Negative.   Genitourinary: Positive for dysuria and vaginal discharge.   Physical  Exam Triage Vital Signs ED Triage Vitals [08/16/18 1202]  Enc Vitals Group     BP (!) 150/104     Pulse Rate 88     Resp 16     Temp 98.2 F (36.8 C)     Temp Source Oral     SpO2 99 %     Weight 264 lb (119.7 kg)     Height 5\' 6"  (1.676 m)     Head Circumference      Peak Flow      Pain Score 0     Pain Loc      Pain Edu?      Excl. in GC?    Updated Vital Signs BP (!) 150/104 (BP Location: Left Arm)   Pulse 88   Temp 98.2 F (36.8 C) (Oral)   Resp 16   Ht 5\' 6"  (1.676 m)   Wt 119.7 kg   LMP 07/08/2018 (Exact Date)   SpO2 99%   BMI 42.61 kg/m   Visual Acuity Right Eye Distance:   Left Eye Distance:   Bilateral Distance:    Right Eye Near:   Left Eye Near:    Bilateral Near:     Physical Exam  Constitutional: She is oriented to person, place, and time. She appears well-developed. No distress.  Cardiovascular: Normal rate and regular  rhythm.  Pulmonary/Chest: Effort normal and breath sounds normal. She has no wheezes. She has no rales.  Neurological: She is alert and oriented to person, place, and time.  Psychiatric: She has a normal mood and affect. Her behavior is normal.     UC Treatments / Results  Labs (all labs ordered are listed, but only abnormal results are displayed) Labs Reviewed  WET PREP, GENITAL - Abnormal; Notable for the following components:      Result Value   Yeast Wet Prep HPF POC PRESENT (*)    WBC, Wet Prep HPF POC FEW (*)    All other components within normal limits  URINALYSIS, COMPLETE (UACMP) WITH MICROSCOPIC - Abnormal; Notable for the following components:   Specific Gravity, Urine >1.030 (*)    Protein, ur TRACE (*)    All other components within normal limits    EKG None  Radiology No results found.  Procedures Procedures (including critical care time)  Medications Ordered in UC Medications - No data to display  Initial Impression / Assessment and Plan / UC Course  I have reviewed the triage vital signs and  the nursing notes.  Pertinent labs & imaging results that were available during my care of the patient were reviewed by me and considered in my medical decision making (see chart for details).    27 year old female presents with vaginal discharge.  Wet prep with yeast.  Treating with Diflucan.  Final Clinical Impressions(s) / UC Diagnoses   Final diagnoses:  Yeast vaginitis   Discharge Instructions   None    ED Prescriptions    Medication Sig Dispense Auth. Provider   fluconazole (DIFLUCAN) 150 MG tablet  (Status: Discontinued) Take 1 tablet (150 mg total) by mouth once for 1 dose. Additional dose in 72 hours. 2 tablet Karlie Aung G, DO   fluconazole (DIFLUCAN) 150 MG tablet Take 1 tablet (150 mg total) by mouth once for 1 dose. Additional dose in 72 hours. 2 tablet Tommie Samsook, Chanya Chrisley G, DO     Controlled Substance Prescriptions [place] Controlled Substance Registry consulted? Not Applicable   Tommie SamsCook, Vieva Brummitt G, DO 08/16/18 1315

## 2018-08-16 NOTE — ED Triage Notes (Addendum)
Patient in today c/o vaginal discharge and itching. Patient was seen 07/25/18 and treated for Trichomonas with Flagyl. Patient completed treatment, but symptoms never resolved. Patient does state that she is having some slight dysuria.

## 2018-09-14 ENCOUNTER — Emergency Department
Admission: EM | Admit: 2018-09-14 | Discharge: 2018-09-14 | Disposition: A | Discharge status: Home / Self Care | Payer: Self-pay | Attending: Emergency Medicine | Admitting: Emergency Medicine

## 2018-09-14 ENCOUNTER — Encounter: Payer: Self-pay | Admitting: Intensive Care

## 2018-09-14 ENCOUNTER — Other Ambulatory Visit: Payer: Self-pay

## 2018-09-14 DIAGNOSIS — F17210 Nicotine dependence, cigarettes, uncomplicated: Secondary | ICD-10-CM | POA: Insufficient documentation

## 2018-09-14 DIAGNOSIS — R110 Nausea: Secondary | ICD-10-CM | POA: Insufficient documentation

## 2018-09-14 DIAGNOSIS — R42 Dizziness and giddiness: Secondary | ICD-10-CM | POA: Insufficient documentation

## 2018-09-14 DIAGNOSIS — I10 Essential (primary) hypertension: Secondary | ICD-10-CM | POA: Insufficient documentation

## 2018-09-14 LAB — BASIC METABOLIC PANEL
ANION GAP: 9 (ref 5–15)
BUN: 11 mg/dL (ref 6–20)
CO2: 24 mmol/L (ref 22–32)
Calcium: 9.2 mg/dL (ref 8.9–10.3)
Chloride: 107 mmol/L (ref 98–111)
Creatinine, Ser: 0.79 mg/dL (ref 0.44–1.00)
Glucose, Bld: 137 mg/dL — ABNORMAL HIGH (ref 70–99)
POTASSIUM: 3.6 mmol/L (ref 3.5–5.1)
SODIUM: 140 mmol/L (ref 135–145)

## 2018-09-14 LAB — CBC
HEMATOCRIT: 44.8 % (ref 35.0–47.0)
HEMOGLOBIN: 15.6 g/dL (ref 12.0–16.0)
MCH: 31 pg (ref 26.0–34.0)
MCHC: 34.9 g/dL (ref 32.0–36.0)
MCV: 88.8 fL (ref 80.0–100.0)
PLATELETS: 245 10*3/uL (ref 150–440)
RBC: 5.04 MIL/uL (ref 3.80–5.20)
RDW: 13 % (ref 11.5–14.5)
WBC: 14.9 10*3/uL — ABNORMAL HIGH (ref 3.6–11.0)

## 2018-09-14 MED ORDER — LISINOPRIL-HYDROCHLOROTHIAZIDE 10-12.5 MG PO TABS: 1.0000 | ORAL_TABLET | Freq: Every day | ORAL | 2 refills | Status: DC

## 2018-09-14 MED ORDER — MECLIZINE HCL 25 MG PO TABS
50.0000 mg | ORAL_TABLET | Freq: Once | ORAL | Status: AC
  Administered 2018-09-14: 50 mg via ORAL
  Filled 2018-09-14: qty 2

## 2018-09-14 MED ORDER — DIAZEPAM 5 MG PO TABS
5.0000 mg | ORAL_TABLET | Freq: Once | ORAL | Status: AC
  Administered 2018-09-14: 5 mg via ORAL
  Filled 2018-09-14: qty 1

## 2018-09-14 MED ORDER — MECLIZINE HCL 25 MG PO TABS: 25.0000 mg | ORAL_TABLET | Freq: Three times a day (TID) | ORAL | 1 refills | Status: DC | PRN

## 2018-09-14 NOTE — ED Provider Notes (Signed)
[facility] Emergency Department Provider Note       Time seen: ----------------------------------------- 6:38 PM on 09/14/2018 -----------------------------------------   I have reviewed the triage vital signs and the nursing notes.  HISTORY   Chief Complaint No chief complaint on file.    HPI Javier Mamone is a 27 y.o. female with a history of hypertension, kidney stones and UTI who presents to the ED for symptoms of vertigo.  Patient describes dizziness as room spinning sensation.  She states it got worse this morning and was associated with nausea.  She reports a history of this in the past, also notes she was on blood pressure medicine but has not had any for the last year.  She denies any recent illness, she denies that she could be pregnant or be dehydrated.  Past Medical History:  Diagnosis Date  . Hypertension   . Kidney stones   . UTI (urinary tract infection)     There are no active problems to display for this patient.   Past Surgical History:  Procedure Laterality Date  . CHOLECYSTECTOMY    . LITHOTRIPSY      Allergies Patient has no known allergies.  Social History Social History   Tobacco Use  . Smoking status: Current Every Day Smoker    Packs/day: 1.00    Types: Cigarettes  . Smokeless tobacco: Never Used  Substance Use Topics  . Alcohol use: No  . Drug use: No   Review of Systems Constitutional: Negative for fever. Cardiovascular: Negative for chest pain. Respiratory: Negative for shortness of breath. Gastrointestinal: Negative for abdominal pain, vomiting and diarrhea. Musculoskeletal: Negative for back pain. Skin: Negative for rash. Neurological: Negative for headaches, focal weakness or numbness.  Positive for dizziness  All systems negative/normal/unremarkable except as stated in the HPI  ____________________________________________   PHYSICAL EXAM:  VITAL SIGNS: ED Triage Vitals  Enc Vitals Group      BP 09/14/18 1817 (!) 161/103     Pulse Rate 09/14/18 1817 98     Resp 09/14/18 1817 20     Temp 09/14/18 1817 98.2 F (36.8 C)     Temp Source 09/14/18 1817 Oral     SpO2 09/14/18 1817 99 %     Weight 09/14/18 1818 284 lb 1.6 oz (128.9 kg)     Height 09/14/18 1818 5\' 6"  (1.676 m)     Head Circumference --      Peak Flow --      Pain Score 09/14/18 1818 0     Pain Loc --      Pain Edu? --      Excl. in GC? --    Constitutional: Alert and oriented. Well appearing and in no distress. Eyes: Conjunctivae are normal. Normal extraocular movements. ENT   Head: Normocephalic and atraumatic.   Nose: No congestion/rhinnorhea.   Mouth/Throat: Mucous membranes are moist.   Neck: No stridor. Cardiovascular: Normal rate, regular rhythm. No murmurs, rubs, or gallops. Respiratory: Normal respiratory effort without tachypnea nor retractions. Breath sounds are clear and equal bilaterally. No wheezes/rales/rhonchi. Gastrointestinal: Soft and nontender. Normal bowel sounds Musculoskeletal: Nontender with normal range of motion in extremities. No lower extremity tenderness nor edema. Neurologic:  Normal speech and language. No gross focal neurologic deficits are appreciated.  Skin:  Skin is warm, dry and intact. No rash noted. Psychiatric: Mood and affect are normal. Speech and behavior are normal.  ____________________________________________  EKG: Interpreted by me.  Sinus tachycardia with a rate of 105 bpm, nonspecific  T wave abnormalities, normal QRS, normal QT  ____________________________________________  ED COURSE:  As part of my medical decision making, I reviewed the following data within the electronic MEDICAL RECORD NUMBER History obtained from family if available, nursing notes, old chart and ekg, as well as notes from prior ED visits. Patient presented for dizziness, we will assess with labs and imaging as indicated at this time.    Procedures ____________________________________________   LABS (pertinent positives/negatives)  Labs Reviewed  BASIC METABOLIC PANEL - Abnormal; Notable for the following components:      Result Value   Glucose, Bld 137 (*)    All other components within normal limits  CBC - Abnormal; Notable for the following components:   WBC 14.9 (*)    All other components within normal limits  URINALYSIS, COMPLETE (UACMP) WITH MICROSCOPIC  POC URINE PREG, ED   ____________________________________________  DIFFERENTIAL DIAGNOSIS   Vertigo, hypertension, medication noncompliance, dehydration, electrolyte abnormality, pregnancy  FINAL ASSESSMENT AND PLAN  Vertigo, medication refill   Plan: The patient had presented for symptoms of peripheral vertigo and was noted to be hypertensive but has been off of her medications for the past year. Patient's labs revealed mild leukocytosis of uncertain significance but otherwise no acute process.  She was somewhat orthostatic here on evaluation which may be an additional problem.  She has symptoms of peripheral vertigo.  I advised increasing her fluid intake, I will prescribe meclizine and restart her hypertensive medications.   Ulice DashJohnathan E Williams, MD   Note: This note was generated in part or whole with voice recognition software. Voice recognition is usually quite accurate but there are transcription errors that can and very often do occur. I apologize for any typographical errors that were not detected and corrected.     Emily FilbertWilliams, Jonathan E, MD 09/14/18 2012

## 2018-09-14 NOTE — ED Notes (Signed)
ED Provider at bedside. 

## 2018-09-14 NOTE — ED Triage Notes (Signed)
Patient states "I have been dizzy for the past week and this morning it got worse with nausea" Patient ambulatory in triage with no problems. Able to speak in complete sentences. NAd noted

## 2018-09-14 NOTE — ED Notes (Signed)
Pt ambulatory to treatment room with steady gait noted.  

## 2019-02-26 ENCOUNTER — Ambulatory Visit
Admission: EM | Admit: 2019-02-26 | Discharge: 2019-02-26 | Disposition: A | Discharge status: Home / Self Care | Payer: Self-pay | Attending: Family Medicine | Admitting: Family Medicine

## 2019-02-26 ENCOUNTER — Other Ambulatory Visit: Payer: Self-pay

## 2019-02-26 ENCOUNTER — Encounter: Payer: Self-pay | Admitting: Gynecology

## 2019-02-26 DIAGNOSIS — B9689 Other specified bacterial agents as the cause of diseases classified elsewhere: Secondary | ICD-10-CM

## 2019-02-26 DIAGNOSIS — N390 Urinary tract infection, site not specified: Secondary | ICD-10-CM

## 2019-02-26 DIAGNOSIS — N760 Acute vaginitis: Secondary | ICD-10-CM

## 2019-02-26 DIAGNOSIS — I10 Essential (primary) hypertension: Secondary | ICD-10-CM

## 2019-02-26 LAB — CHLAMYDIA/NGC RT PCR (ARMC ONLY)
CHLAMYDIA TR: NOT DETECTED
N GONORRHOEAE: NOT DETECTED

## 2019-02-26 LAB — WET PREP, GENITAL
Sperm: NONE SEEN
Trich, Wet Prep: NONE SEEN
Yeast Wet Prep HPF POC: NONE SEEN

## 2019-02-26 LAB — URINALYSIS, COMPLETE (UACMP) WITH MICROSCOPIC
BILIRUBIN URINE: NEGATIVE
GLUCOSE, UA: NEGATIVE mg/dL
HGB URINE DIPSTICK: NEGATIVE
KETONES UR: NEGATIVE mg/dL
Nitrite: NEGATIVE
PROTEIN: NEGATIVE mg/dL
RBC / HPF: NONE SEEN RBC/hpf (ref 0–5)
Specific Gravity, Urine: 1.025 (ref 1.005–1.030)
pH: 6.5 (ref 5.0–8.0)

## 2019-02-26 LAB — PREGNANCY, URINE: Preg Test, Ur: NEGATIVE

## 2019-02-26 MED ORDER — METRONIDAZOLE 500 MG PO TABS: 500.0000 mg | ORAL_TABLET | Freq: Two times a day (BID) | ORAL | 0 refills | Status: DC

## 2019-02-26 MED ORDER — CEPHALEXIN 500 MG PO CAPS: 500.0000 mg | ORAL_CAPSULE | Freq: Two times a day (BID) | ORAL | 0 refills | Status: AC

## 2019-02-26 NOTE — ED Provider Notes (Signed)
[facility] ____________________________________________  Time seen: Approximately 2:35 PM  I have reviewed the triage vital signs and the nursing notes.   HISTORY  Chief Complaint SEXUALLY TRANSMITTED DISEASE  HPI Mary Gilbert is a 28 y.o. female presenting for evaluation of few days of urinary frequency, some burning with urination and thick odorous white vaginal discharge.  States she also would like to be tested for STDs.  States that she was sexually active with a partner 3 weeks ago, condom broke, and states he potentially had an STD.  Patient unsure of details of this and she further states that she is suspicious that patient is only threatening this and did not actually have an STD.  Patient requests gonorrhea and Chlamydia testing.  Denies other STD testing.  Some intermittent low back pain.  Denies abdominal pain, fevers, rash, lesions, chest pain or shortness of breath.  Denies recent sickness.  Denies recent values.  Denies current pregnancy.  Patient's last menstrual period was 01/26/2019.Denies pregnancy.  States irregular periods.    Past Medical History:  Diagnosis Date  . Hypertension   . Kidney stones   . UTI (urinary tract infection)     There are no active problems to display for this patient.   Past Surgical History:  Procedure Laterality Date  . CHOLECYSTECTOMY    . LITHOTRIPSY       No current facility-administered medications for this encounter.   Current Outpatient Medications:  .  cephALEXin (KEFLEX) 500 MG capsule, Take 1 capsule (500 mg total) by mouth 2 (two) times daily for 7 days., Disp: 14 capsule, Rfl: 0 .  fluticasone (FLONASE) 50 MCG/ACT nasal spray, Place 2 sprays into both nostrils daily., Disp: 16 g, Rfl: 0 .  lisinopril-hydrochlorothiazide (ZESTORETIC) 10-12.5 MG tablet, Take 1 tablet by mouth daily., Disp: 30 tablet, Rfl: 2 .  metroNIDAZOLE (FLAGYL) 500 MG tablet, Take 1 tablet (500 mg total) by mouth 2 (two) times  daily., Disp: 14 tablet, Rfl: 0  Allergies Patient has no known allergies.  Family History  Problem Relation Age of Onset  . Mental illness Mother   . Heart disease Father   . Hypertension Father     Social History Social History   Tobacco Use  . Smoking status: Current Every Day Smoker    Packs/day: 1.00    Types: Cigarettes  . Smokeless tobacco: Never Used  Substance Use Topics  . Alcohol use: No  . Drug use: No    Review of Systems Constitutional: No fever ENT: No sore throat. Cardiovascular: Denies chest pain. Respiratory: Denies shortness of breath. Gastrointestinal: No abdominal pain.  No nausea, no vomiting.  No diarrhea.   Genitourinary: positive for dysuria. Musculoskeletal: positive for back pain. Skin: Negative for rash.   ____________________________________________   PHYSICAL EXAM:  VITAL SIGNS: ED Triage Vitals  Enc Vitals Group     BP 02/26/19 1157 (!) 141/100     Pulse Rate 02/26/19 1157 89     Resp --      Temp 02/26/19 1157 98.1 F (36.7 C)     Temp Source 02/26/19 1157 Oral     SpO2 02/26/19 1157 99 %     Weight 02/26/19 1201 280 lb (127 kg)     Height 02/26/19 1201 5\' 6"  (1.676 m)     Head Circumference --      Peak Flow --      Pain Score 02/26/19 1201 2     Pain Loc --  Pain Edu? --      Excl. in GC? --     Constitutional: Alert and oriented. Well appearing and in no acute distress. ENT      Head: Normocephalic and atraumatic. Cardiovascular: Normal rate, regular rhythm. Grossly normal heart sounds.  Good peripheral circulation. Respiratory: Normal respiratory effort without tachypnea nor retractions. Breath sounds are clear and equal bilaterally. No wheezes, rales, rhonchi. Gastrointestinal: Soft and nontender.  Obese abdomen.  No CVA tenderness. Musculoskeletal: Mild diffuse lower lumbar tenderness palpation.  No CVA tenderness. Neurologic:  Normal speech and language. Speech is normal. No gait instability.  Skin:  Skin  is warm, dry and intact. No rash noted. Psychiatric: Mood and affect are normal. Speech and behavior are normal. Patient exhibits appropriate insight and judgment   ___________________________________________   LABS (all labs ordered are listed, but only abnormal results are displayed)  Labs Reviewed  WET PREP, GENITAL - Abnormal; Notable for the following components:      Result Value   Clue Cells Wet Prep HPF POC PRESENT (*)    WBC, Wet Prep HPF POC MODERATE (*)    All other components within normal limits  URINALYSIS, COMPLETE (UACMP) WITH MICROSCOPIC - Abnormal; Notable for the following components:   APPearance HAZY (*)    Leukocytes,Ua TRACE (*)    Bacteria, UA MANY (*)    All other components within normal limits  CHLAMYDIA/NGC RT PCR ([facility] ONLY)  PREGNANCY, URINE   ____________________________________________   PROCEDURES Procedures   INITIAL IMPRESSION / ASSESSMENT AND PLAN / ED COURSE  Pertinent labs & imaging results that were available during my care of the patient were reviewed by me and considered in my medical decision making (see chart for details).  Well-appearing patient.  No acute distress.  Patient elected for self wet prep.  Requests gonorrhea and chlamydia testing, declines other further STD testing.  Patient wet prep positive for clue cells.  Urinalysis reviewed, suspect UTI.  Will treat with oral Flagyl and oral Keflex.  Encouraged pelvic rest.  Follow-up with primary care in 1 week.  Rest, fluids, monitoring.Discussed indication, risks and benefits of medications with patient.  Will await remaining labs.  Discussed follow up with Primary care physician this week. Discussed follow up and return parameters including no resolution or any worsening concerns. Patient verbalized understanding and agreed to plan.   ____________________________________________   FINAL CLINICAL IMPRESSION(S) / ED DIAGNOSES  Final diagnoses:  Bacterial vaginosis    Urinary tract infection without hematuria, site unspecified     ED Discharge Orders         Ordered    metroNIDAZOLE (FLAGYL) 500 MG tablet  2 times daily     02/26/19 1325    cephALEXin (KEFLEX) 500 MG capsule  2 times daily     02/26/19 1325           Note: This dictation was prepared with Dragon dictation along with smaller phrase technology. Any transcriptional errors that result from this process are unintentional.         Renford Dills, NP 02/26/19 1439

## 2019-02-26 NOTE — Discharge Instructions (Addendum)
Take medication as prescribed. Rest. Drink plenty of fluids. No sexual activity for at least one week.   Follow up with your primary care physician this week as needed. Return to Urgent care for new or worsening concerns.

## 2019-02-26 NOTE — ED Triage Notes (Signed)
Per patient c/o slept with a guy who told her he has std. Per patient would like to be tested. Patient also c/o burning and cloudy urination / thick whitish discharge.

## 2019-07-28 IMAGING — CR DG CHEST 2V
2 series · 2 of 2 positions shown · non-contrast
Comparison: None.

CLINICAL DATA: Three-week history of cough.

EXAM:
CHEST  2 VIEW

[chest pa]
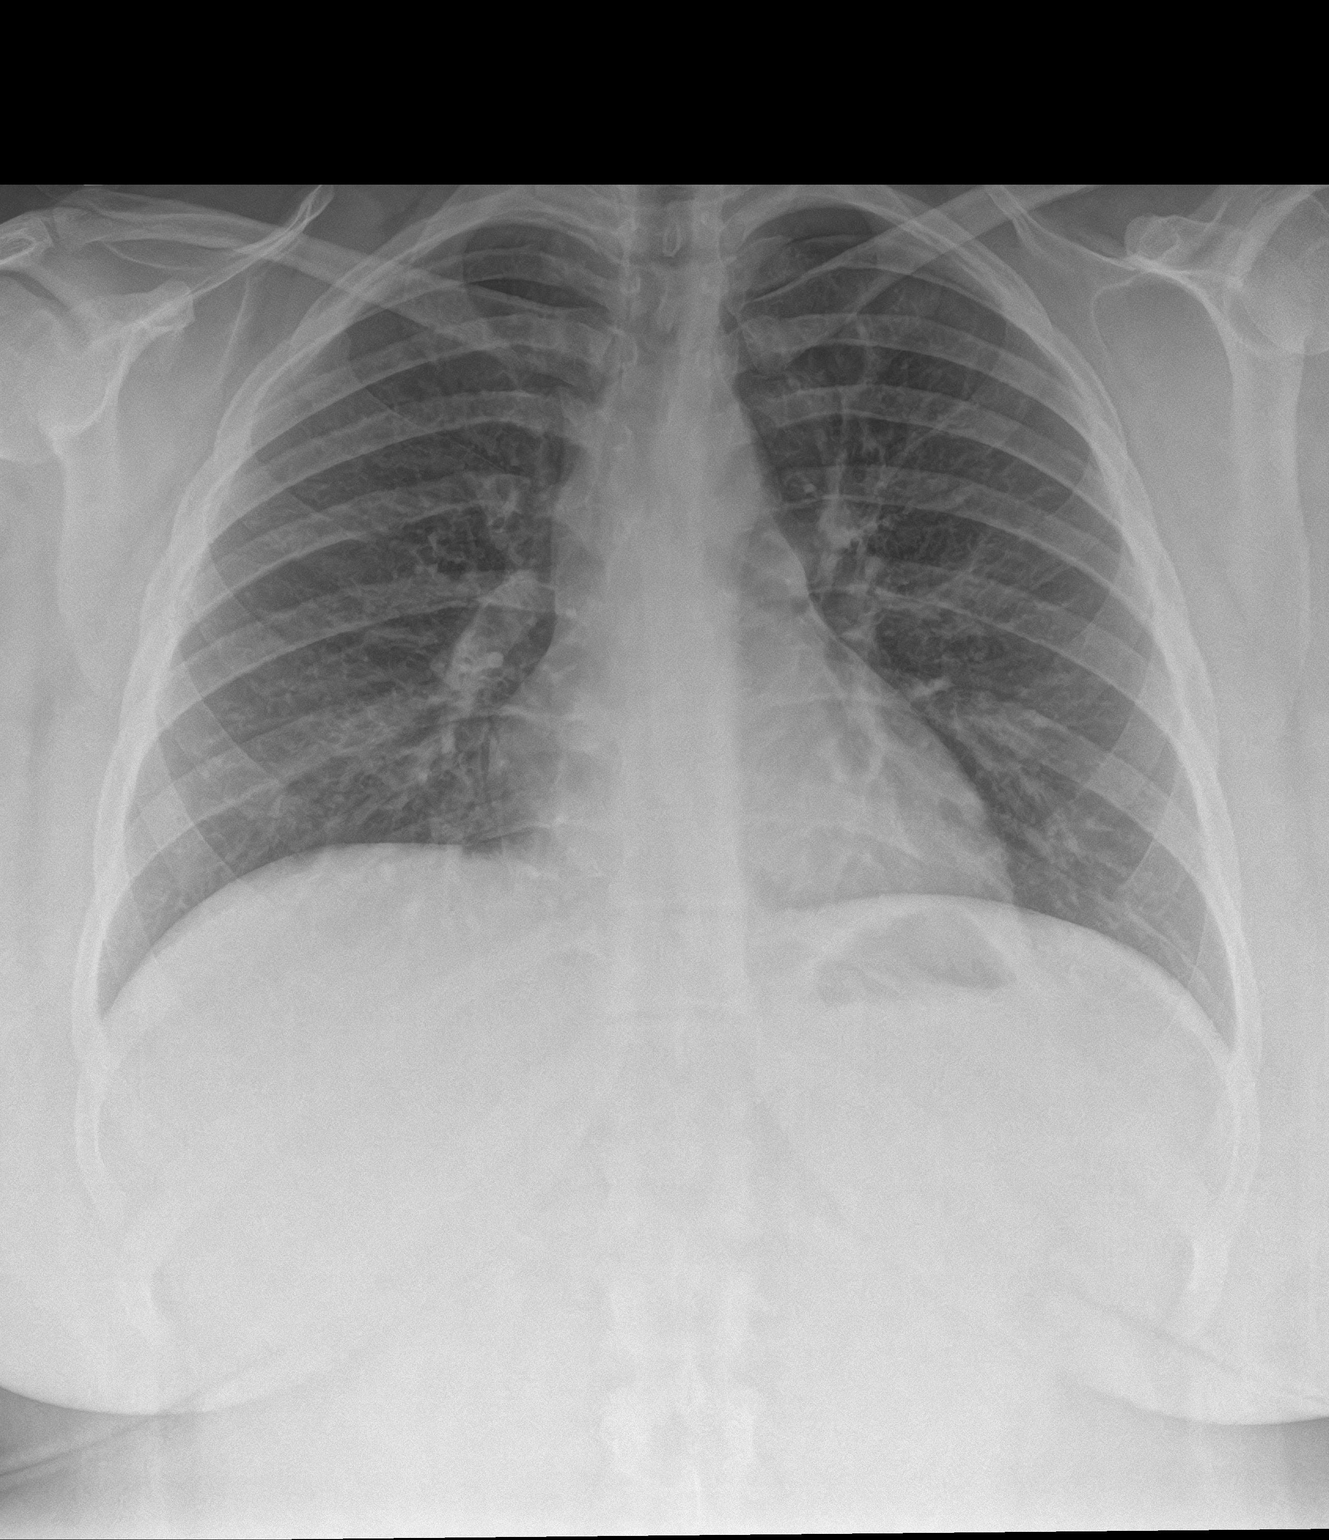

[chest lat]
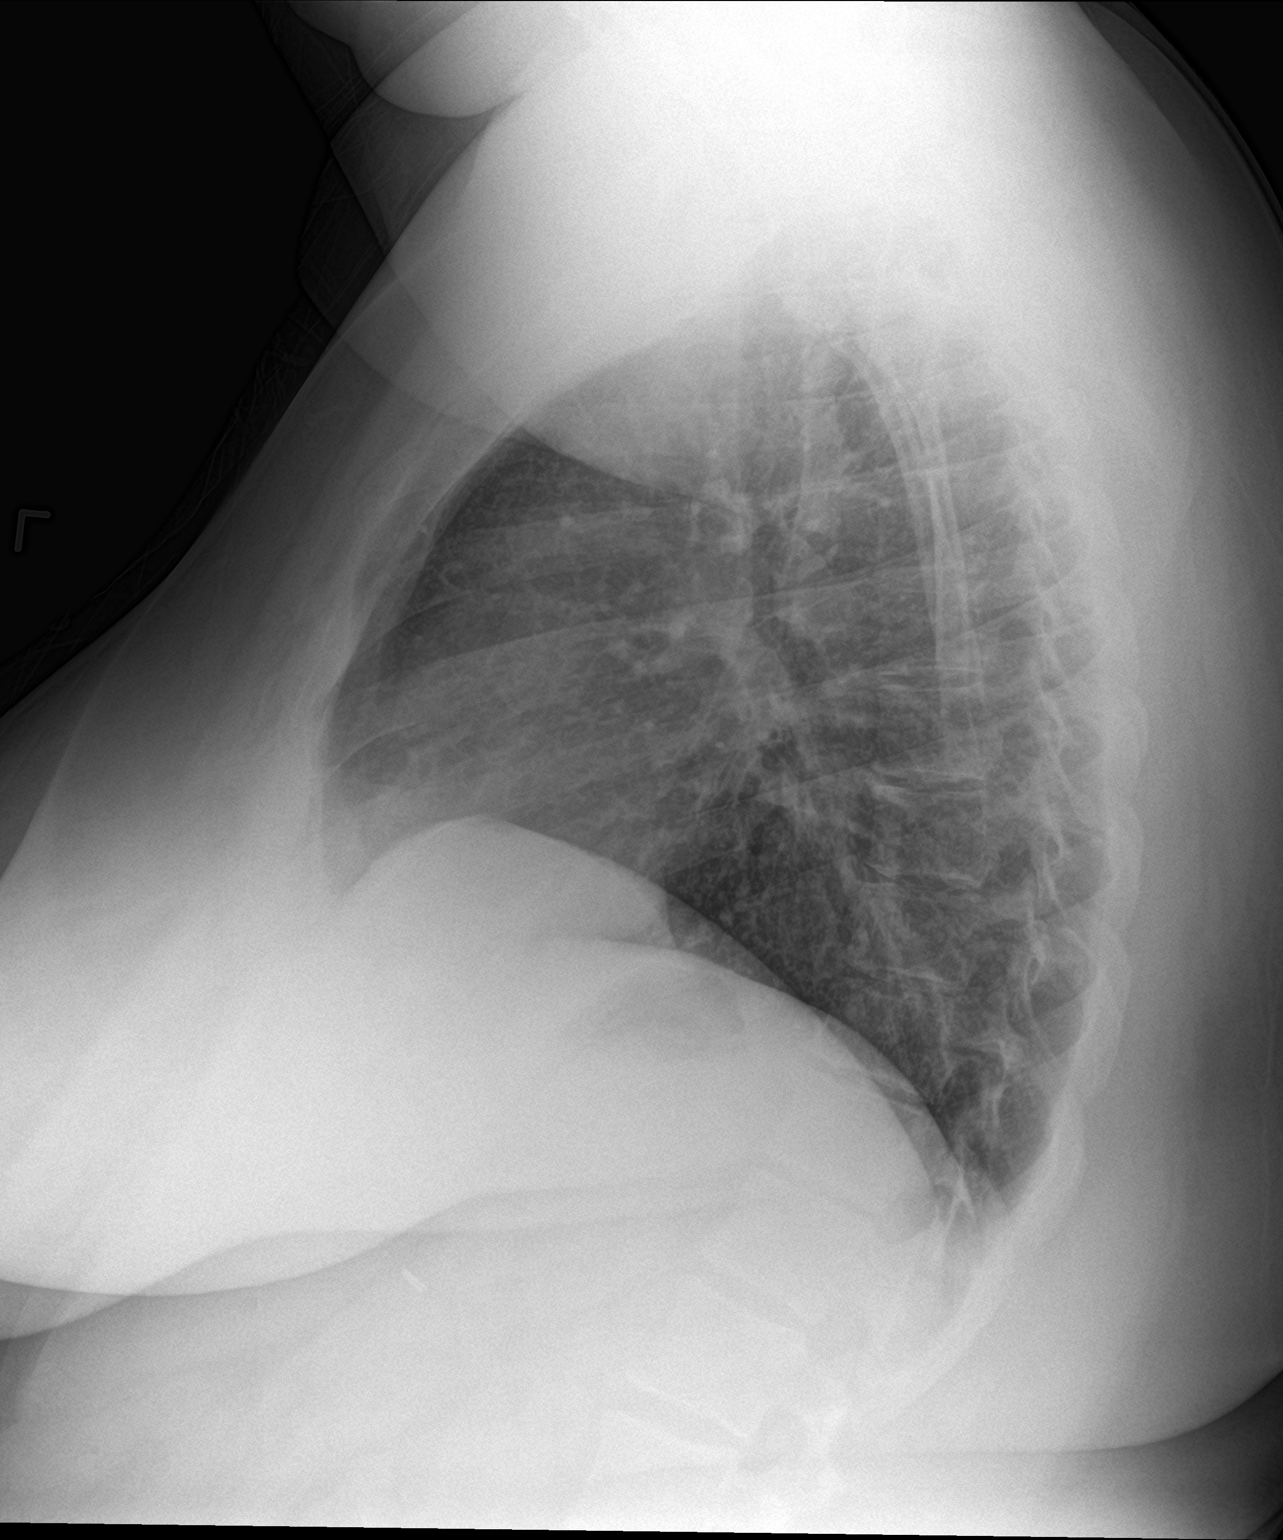

[2 of 2 positions shown; findings below may reference images not displayed]

FINDINGS: The heart size and mediastinal contours are within normal limits.
Both lungs are clear. The visualized skeletal structures are
unremarkable.
IMPRESSION: No active cardiopulmonary disease.

## 2019-08-04 ENCOUNTER — Emergency Department: Payer: Medicaid Other

## 2019-08-04 ENCOUNTER — Other Ambulatory Visit: Payer: Self-pay

## 2019-08-04 ENCOUNTER — Encounter: Payer: Self-pay | Admitting: Emergency Medicine

## 2019-08-04 ENCOUNTER — Emergency Department
Admission: EM | Admit: 2019-08-04 | Discharge: 2019-08-04 | Disposition: A | Discharge status: Home / Self Care | Payer: Medicaid Other | Attending: Emergency Medicine | Admitting: Emergency Medicine

## 2019-08-04 DIAGNOSIS — I10 Essential (primary) hypertension: Secondary | ICD-10-CM | POA: Insufficient documentation

## 2019-08-04 DIAGNOSIS — F17210 Nicotine dependence, cigarettes, uncomplicated: Secondary | ICD-10-CM | POA: Diagnosis not present

## 2019-08-04 DIAGNOSIS — Z3A00 Weeks of gestation of pregnancy not specified: Secondary | ICD-10-CM | POA: Insufficient documentation

## 2019-08-04 DIAGNOSIS — Z79899 Other long term (current) drug therapy: Secondary | ICD-10-CM | POA: Insufficient documentation

## 2019-08-04 DIAGNOSIS — O4690 Antepartum hemorrhage, unspecified, unspecified trimester: Secondary | ICD-10-CM

## 2019-08-04 DIAGNOSIS — O30091 Twin pregnancy, unable to determine number of placenta and number of amniotic sacs, first trimester: Secondary | ICD-10-CM | POA: Diagnosis not present

## 2019-08-04 DIAGNOSIS — R102 Pelvic and perineal pain: Secondary | ICD-10-CM

## 2019-08-04 DIAGNOSIS — O26851 Spotting complicating pregnancy, first trimester: Secondary | ICD-10-CM | POA: Diagnosis present

## 2019-08-04 LAB — CBC WITH DIFFERENTIAL/PLATELET
Abs Immature Granulocytes: 0.04 10*3/uL (ref 0.00–0.07)
Basophils Absolute: 0.1 10*3/uL (ref 0.0–0.1)
Basophils Relative: 0 %
Eosinophils Absolute: 0.2 10*3/uL (ref 0.0–0.5)
Eosinophils Relative: 1 %
HCT: 42.8 % (ref 36.0–46.0)
Hemoglobin: 14.3 g/dL (ref 12.0–15.0)
Immature Granulocytes: 0 %
Lymphocytes Relative: 28 %
Lymphs Abs: 3.2 10*3/uL (ref 0.7–4.0)
MCH: 30.1 pg (ref 26.0–34.0)
MCHC: 33.4 g/dL (ref 30.0–36.0)
MCV: 90.1 fL (ref 80.0–100.0)
Monocytes Absolute: 0.8 10*3/uL (ref 0.1–1.0)
Monocytes Relative: 7 %
Neutro Abs: 7 10*3/uL (ref 1.7–7.7)
Neutrophils Relative %: 64 %
Platelets: 269 10*3/uL (ref 150–400)
RBC: 4.75 MIL/uL (ref 3.87–5.11)
RDW: 12.9 % (ref 11.5–15.5)
WBC: 11.2 10*3/uL — ABNORMAL HIGH (ref 4.0–10.5)
nRBC: 0 % (ref 0.0–0.2)

## 2019-08-04 LAB — URINALYSIS, COMPLETE (UACMP) WITH MICROSCOPIC
Bilirubin Urine: NEGATIVE
Glucose, UA: NEGATIVE mg/dL
Hgb urine dipstick: NEGATIVE
Ketones, ur: NEGATIVE mg/dL
Leukocytes,Ua: NEGATIVE
Nitrite: NEGATIVE
Protein, ur: NEGATIVE mg/dL
Specific Gravity, Urine: 1.004 — ABNORMAL LOW (ref 1.005–1.030)
pH: 8 (ref 5.0–8.0)

## 2019-08-04 LAB — ABO/RH: ABO/RH(D): A POS

## 2019-08-04 LAB — BASIC METABOLIC PANEL
Anion gap: 10 (ref 5–15)
BUN: <5 mg/dL — ABNORMAL LOW (ref 6–20)
CO2: 22 mmol/L (ref 22–32)
Calcium: 8.5 mg/dL — ABNORMAL LOW (ref 8.9–10.3)
Chloride: 106 mmol/L (ref 98–111)
Creatinine, Ser: 0.45 mg/dL (ref 0.44–1.00)
GFR calc Af Amer: >60 mL/min (ref >60)
GFR calc non Af Amer: >60 mL/min (ref >60)
Glucose, Bld: 94 mg/dL (ref 70–99)
Potassium: 3.7 mmol/L (ref 3.5–5.1)
Sodium: 138 mmol/L (ref 135–145)

## 2019-08-04 LAB — HCG, QUANTITATIVE, PREGNANCY: hCG, Beta Chain, Quant, S: 4438 m[IU]/mL — ABNORMAL HIGH (ref <5)

## 2019-08-04 MED ORDER — CEPHALEXIN 500 MG PO CAPS: 500.0000 mg | ORAL_CAPSULE | Freq: Three times a day (TID) | ORAL | 0 refills | Status: AC

## 2019-08-04 NOTE — ED Notes (Signed)
PT given water with permission of MD

## 2019-08-04 NOTE — ED Triage Notes (Signed)
Pt states she had pos preg test last week and is having vaginal bleeding and cramping today. LMP was 6/29. NAD noted

## 2019-08-04 NOTE — Discharge Instructions (Addendum)
I recommend taking a prenatal vitamin.  Drink plenty of fluids.  Take the antibiotics as prescribed.  Follow-up with your primary doctor or an OB/GYN in the next 48 to 72 hours for repeat exam and checkup.  If you develop worsening vaginal bleeding or pain, return to the ER.

## 2019-08-04 NOTE — ED Notes (Signed)
Patient transported to Ultrasound 

## 2019-08-04 NOTE — ED Provider Notes (Signed)
[facility] Emergency Department Provider Note  ____________________________________________   First MD Initiated Contact with Patient 08/04/19 [phone]     (approximate)  I have reviewed the triage vital signs and the nursing notes.   HISTORY  Chief Complaint No chief complaint on file.    HPI Mary Gilbert is a 28 y.o. female  Here with vaginal bleeding. Pt states that over the past 2-3 days, she's had light vaginal spotting and cramping. Pt just found out that she was pregnant - was not planning on becoming pregnant. She believes she is 5-6 weeks based on her LMP. She is a [id number] with h/o 2 spontaneous first trimester miscarriages. Denies known h/o lupus anticoagulant or other autoimmune d/o. Denies any blood thinner use or h/o easy bruising. She reports mild, suprapubic abd pain and cramping, denies any other complaitns. No vaginal discharge or dyspareunia. No other complaints. Cramping is mild, SP, feels similar to her usual menstrual cramps.        Past Medical History:  Diagnosis Date   Hypertension    Kidney stones    UTI (urinary tract infection)     There are no active problems to display for this patient.   Past Surgical History:  Procedure Laterality Date   CHOLECYSTECTOMY     LITHOTRIPSY      Prior to Admission medications   Medication Sig Start Date End Date Taking? Authorizing Provider  amLODipine (NORVASC) 5 MG tablet Take 5 mg by mouth daily.   Yes [provider]  Multiple Vitamin (MULTIVITAMIN WITH MINERALS) TABS tablet Take 1 tablet by mouth daily.   Yes [provider]  cephALEXin (KEFLEX) 500 MG capsule Take 1 capsule (500 mg total) by mouth 3 (three) times daily for 7 days. 08/04/19 08/11/19  Duffy Bruce, MD    Allergies Patient has no known allergies.  Family History  Problem Relation Age of Onset   Mental illness Mother    Heart disease Father    Hypertension Father     Social  History Social History   Tobacco Use   Smoking status: Current Every Day Smoker    Packs/day: 1.00    Types: Cigarettes   Smokeless tobacco: Never Used  Substance Use Topics   Alcohol use: No   Drug use: No    Review of Systems  Review of Systems  Constitutional: Positive for fatigue. Negative for fever.  HENT: Negative for congestion and sore throat.   Eyes: Negative for visual disturbance.  Respiratory: Negative for cough and shortness of breath.   Cardiovascular: Negative for chest pain.  Gastrointestinal: Positive for abdominal pain. Negative for diarrhea, nausea and vomiting.  Genitourinary: Positive for pelvic pain and vaginal bleeding. Negative for flank pain.  Musculoskeletal: Negative for back pain and neck pain.  Skin: Negative for rash and wound.  Neurological: Negative for weakness.  All other systems reviewed and are negative.    ____________________________________________  PHYSICAL EXAM:      VITAL SIGNS: ED Triage Vitals [08/04/19 0939]  Enc Vitals Group     BP (!) 159/104     Pulse Rate (!) 103     Resp 16     Temp 98.5 F (36.9 C)     Temp Source Oral     SpO2 99 %     Weight      Height      Head Circumference      Peak Flow      Pain Score 2  Pain Loc      Pain Edu?      Excl. in GC?      Physical Exam Vitals signs and nursing note reviewed.  Constitutional:      General: She is not in acute distress.    Appearance: She is well-developed.  HENT:     Head: Normocephalic and atraumatic.  Eyes:     Conjunctiva/sclera: Conjunctivae normal.  Neck:     Musculoskeletal: Neck supple.  Cardiovascular:     Rate and Rhythm: Normal rate and regular rhythm.     Heart sounds: Normal heart sounds. No murmur. No friction rub.  Pulmonary:     Effort: Pulmonary effort is normal. No respiratory distress.     Breath sounds: Normal breath sounds. No wheezing or rales.  Abdominal:     General: There is no distension.     Palpations: Abdomen  is soft.     Tenderness: There is abdominal tenderness (mild, suprapubic).  Genitourinary:    Comments: No significant blood loss or clotting Skin:    General: Skin is warm.     Capillary Refill: Capillary refill takes less than 2 seconds.  Neurological:     Mental Status: She is alert and oriented to person, place, and time.     Motor: No abnormal muscle tone.       ____________________________________________   LABS (all labs ordered are listed, but only abnormal results are displayed)  Labs Reviewed  CBC WITH DIFFERENTIAL/PLATELET - Abnormal; Notable for the following components:      Result Value   WBC 11.2 (*)    All other components within normal limits  BASIC METABOLIC PANEL - Abnormal; Notable for the following components:   BUN <5 (*)    Calcium 8.5 (*)    All other components within normal limits  HCG, QUANTITATIVE, PREGNANCY - Abnormal; Notable for the following components:   hCG, Beta Chain, Quant, S 4,438 (*)    All other components within normal limits  URINALYSIS, COMPLETE (UACMP) WITH MICROSCOPIC - Abnormal; Notable for the following components:   Color, Urine STRAW (*)    APPearance CLEAR (*)    Specific Gravity, Urine 1.004 (*)    Bacteria, UA RARE (*)    All other components within normal limits  ABO/RH    ____________________________________________  EKG: None ________________________________________  RADIOLOGY All imaging, including plain films, CT scans, and ultrasounds, independently reviewed by me, and interpretations confirmed via formal radiology reads.  ED MD interpretation:   U/S: Twin gestational sacs noted, no free fluid, no yolk sac/pole noted so unclear viability  Official radiology report(s): [place] Ob Comp < 14 Wks  Result Date: 08/04/2019 CLINICAL DATA:  Abdominal and pelvic pain for 2-3 days, vaginal spotting for 1 week, pregnant, quantitative beta HCG = 4438 EXAM: TWIN OBSTETRIC <14WK US AND TRANSVAGINAL OB US COMPARISON:  None  FINDINGS: Number of IUPs:  2 Chorionicity/Amnionicity:  Dichorionic diamniotic TWIN 1 Yolk sac:  Present Embryo:  Not identified Cardiac Activity: N/A Heart Rate: N/A bpm MSD: 5.2 mm   5 w   2 d TWIN 2 Yolk sac:  Present Embryo:  Not identified Cardiac Activity: N/A Heart Rate: N/A bpm MSD: 3.0 mm   5 w   0 d Subchorionic hemorrhage:  None visualized. Maternal uterus/adnexae: RIGHT ovary not visualized question obscured by bowel. LEFT ovary normal size and morphology 2.3 x 1.9 x 1.6 cm. Trace free pelvic fluid. No adnexal masses. IMPRESSION: Twin intrauterine gestation with 2 small gestational sacs  seen, each containing a yolk sac. No fetal poles are identified to establish viability. No evidence of subchorionic hemorrhage or other pelvic abnormality. Electronically Signed   By: Ulyses SouthwardMark  Boles M.D.   On: 08/04/2019 13:51   [place] Ob Comp Addl Gest Less 14 Wks  Result Date: 08/04/2019 CLINICAL DATA:  Abdominal and pelvic pain for 2-3 days, vaginal spotting for 1 week, pregnant, quantitative beta HCG = 4438 EXAM: TWIN OBSTETRIC <14WK US AND TRANSVAGINAL OB US COMPARISON:  None FINDINGS: Number of IUPs:  2 Chorionicity/Amnionicity:  Dichorionic diamniotic TWIN 1 Yolk sac:  Present Embryo:  Not identified Cardiac Activity: N/A Heart Rate: N/A bpm MSD: 5.2 mm   5 w   2 d TWIN 2 Yolk sac:  Present Embryo:  Not identified Cardiac Activity: N/A Heart Rate: N/A bpm MSD: 3.0 mm   5 w   0 d Subchorionic hemorrhage:  None visualized. Maternal uterus/adnexae: RIGHT ovary not visualized question obscured by bowel. LEFT ovary normal size and morphology 2.3 x 1.9 x 1.6 cm. Trace free pelvic fluid. No adnexal masses. IMPRESSION: Twin intrauterine gestation with 2 small gestational sacs seen, each containing a yolk sac. No fetal poles are identified to establish viability. No evidence of subchorionic hemorrhage or other pelvic abnormality. Electronically Signed   By: Ulyses SouthwardMark  Boles M.D.   On: 08/04/2019 13:51   [place] Ob  Transvaginal  Result Date: 08/04/2019 CLINICAL DATA:  Abdominal and pelvic pain for 2-3 days, vaginal spotting for 1 week, pregnant, quantitative beta HCG = 4438 EXAM: TWIN OBSTETRIC <14WK US AND TRANSVAGINAL OB US COMPARISON:  None FINDINGS: Number of IUPs:  2 Chorionicity/Amnionicity:  Dichorionic diamniotic TWIN 1 Yolk sac:  Present Embryo:  Not identified Cardiac Activity: N/A Heart Rate: N/A bpm MSD: 5.2 mm   5 w   2 d TWIN 2 Yolk sac:  Present Embryo:  Not identified Cardiac Activity: N/A Heart Rate: N/A bpm MSD: 3.0 mm   5 w   0 d Subchorionic hemorrhage:  None visualized. Maternal uterus/adnexae: RIGHT ovary not visualized question obscured by bowel. LEFT ovary normal size and morphology 2.3 x 1.9 x 1.6 cm. Trace free pelvic fluid. No adnexal masses. IMPRESSION: Twin intrauterine gestation with 2 small gestational sacs seen, each containing a yolk sac. No fetal poles are identified to establish viability. No evidence of subchorionic hemorrhage or other pelvic abnormality. Electronically Signed   By: Ulyses SouthwardMark  Boles M.D.   On: 08/04/2019 13:51    ____________________________________________  PROCEDURES   Procedure(s) performed (including Critical Care):  Procedures  ____________________________________________  INITIAL IMPRESSION / MDM / ASSESSMENT AND PLAN / ED COURSE  As part of my medical decision making, I reviewed the following data within the electronic MEDICAL RECORD NUMBER Notes from prior ED visits and [place] Controlled Substance Database      *Heath GoldHeather Gilbert was evaluated in Emergency Department on 08/04/2019 for the symptoms described in the history of present illness. She was evaluated in the context of the global COVID-19 pandemic, which necessitated consideration that the patient might be at risk for infection with the SARS-CoV-2 virus that causes COVID-19. Institutional protocols and algorithms that pertain to the evaluation of patients at risk for COVID-19 are in a state of rapid change  based on information released by regulatory bodies including the CDC and federal and state organizations. These policies and algorithms were followed during the patient's care in the ED.  Some ED evaluations and interventions may be delayed as a result of limited staffing during the pandemic.*  Medical Decision Making: 28 yo G3P2020 here with vaginal spotting. HCG 4400, U/S shows twin gestational sac. Os is closed. No significant ongoing bleeding or anemia. Rh+, rhogam not indicated. UA with mild bacteriuria.   Suspect threatened AB vs implantation bleeding. This was discussed in detail with pt. Will have her f/u closely with OBGYN. Encouraged prenatal vitamins and will Rx Keflex for bacteriuria.   ____________________________________________  FINAL CLINICAL IMPRESSION(S) / ED DIAGNOSES  Final diagnoses:  Twin gestation, unable to determine number of placenta and number of amniotic sacs in first trimester  Vaginal bleeding in pregnancy     MEDICATIONS GIVEN DURING THIS VISIT:  Medications - No data to display   ED Discharge Orders         Ordered    cephALEXin (KEFLEX) 500 MG capsule  3 times daily     08/04/19 1428           Note:  This document was prepared using Dragon voice recognition software and may include unintentional dictation errors.   Shaune PollackIsaacs, Mileah Hemmer, MD 08/04/19 2005

## 2019-08-14 ENCOUNTER — Encounter: Payer: No Typology Code available for payment source | Admitting: Obstetrics and Gynecology

## 2019-08-15 ENCOUNTER — Other Ambulatory Visit: Payer: Self-pay

## 2019-08-15 ENCOUNTER — Emergency Department
Admission: EM | Admit: 2019-08-15 | Discharge: 2019-08-15 | Disposition: A | Discharge status: Home / Self Care | Payer: Medicaid Other | Attending: Student in an Organized Health Care Education/Training Program | Admitting: Student in an Organized Health Care Education/Training Program

## 2019-08-15 ENCOUNTER — Emergency Department: Payer: Medicaid Other

## 2019-08-15 DIAGNOSIS — N898 Other specified noninflammatory disorders of vagina: Secondary | ICD-10-CM

## 2019-08-15 DIAGNOSIS — L292 Pruritus vulvae: Secondary | ICD-10-CM | POA: Diagnosis not present

## 2019-08-15 DIAGNOSIS — Z79899 Other long term (current) drug therapy: Secondary | ICD-10-CM | POA: Diagnosis not present

## 2019-08-15 DIAGNOSIS — Z3A01 Less than 8 weeks gestation of pregnancy: Secondary | ICD-10-CM | POA: Diagnosis not present

## 2019-08-15 DIAGNOSIS — R102 Pelvic and perineal pain: Secondary | ICD-10-CM | POA: Diagnosis not present

## 2019-08-15 DIAGNOSIS — O3461 Maternal care for abnormality of vagina, first trimester: Secondary | ICD-10-CM | POA: Insufficient documentation

## 2019-08-15 DIAGNOSIS — F17210 Nicotine dependence, cigarettes, uncomplicated: Secondary | ICD-10-CM | POA: Insufficient documentation

## 2019-08-15 DIAGNOSIS — I10 Essential (primary) hypertension: Secondary | ICD-10-CM | POA: Diagnosis not present

## 2019-08-15 LAB — URINALYSIS, ROUTINE W REFLEX MICROSCOPIC
Bilirubin Urine: NEGATIVE
Glucose, UA: NEGATIVE mg/dL
Hgb urine dipstick: NEGATIVE
Ketones, ur: NEGATIVE mg/dL
Leukocytes,Ua: NEGATIVE
Nitrite: NEGATIVE
Protein, ur: NEGATIVE mg/dL
Specific Gravity, Urine: 1.014 (ref 1.005–1.030)
pH: 5 (ref 5.0–8.0)

## 2019-08-15 LAB — WET PREP, GENITAL
Clue Cells Wet Prep HPF POC: NONE SEEN
Sperm: NONE SEEN
Trich, Wet Prep: NONE SEEN
Yeast Wet Prep HPF POC: NONE SEEN

## 2019-08-15 LAB — HCG, QUANTITATIVE, PREGNANCY: hCG, Beta Chain, Quant, S: 49707 m[IU]/mL — ABNORMAL HIGH (ref <5)

## 2019-08-15 MED ORDER — CLOTRIMAZOLE 1 % VA CREA: 1.0000 | TOPICAL_CREAM | Freq: Every day | VAGINAL | 0 refills | Status: DC

## 2019-08-15 NOTE — Discharge Instructions (Addendum)
Please follow-up with OB/GYN.  Please use Clotrimin as prescribed, nightly for 7 days.

## 2019-08-15 NOTE — ED Notes (Signed)
ED Provider at bedside. 

## 2019-08-15 NOTE — ED Triage Notes (Signed)
Pt states she was here on the 7th and dx with UTI, states she thinks the abx did something to her, pt states she is having vaginal pain and milky discharge. States she was told that she is pregnant with twins during her last visit and is requesting an ultrasound.

## 2019-08-15 NOTE — ED Provider Notes (Signed)
[facility] Emergency Department Provider Note    First MD Initiated Contact with Patient 08/15/19 1537     (approximate)  I have reviewed the triage vital signs and the nursing notes.   HISTORY  Chief Complaint Vaginal Discharge    HPI Mary Gilbert is a 28 y.o. female is the ER for vaginal itching and vaginal discharge that started after she was treated for UTI in the setting of twin gestation.  Denies any vaginal bleeding but is having persistent pelvic cramping is concerned for miscarriage which she has had multiple in the past.  Denies any fevers.  No nausea or vomiting.    Past Medical History:  Diagnosis Date  . Hypertension   . Kidney stones   . UTI (urinary tract infection)    Family History  Problem Relation Age of Onset  . Mental illness Mother   . Heart disease Father   . Hypertension Father    Past Surgical History:  Procedure Laterality Date  . CHOLECYSTECTOMY    . LITHOTRIPSY     There are no active problems to display for this patient.     Prior to Admission medications   Medication Sig Start Date End Date Taking? Authorizing Provider  amLODipine (NORVASC) 5 MG tablet Take 5 mg by mouth daily.    [provider]  clotrimazole (GYNE-LOTRIMIN) 1 % vaginal cream Place 1 Applicatorful vaginally at bedtime for 7 days. 08/15/19 08/22/19  Willy Eddyobinson, Shelsey Rieth, MD  Multiple Vitamin (MULTIVITAMIN WITH MINERALS) TABS tablet Take 1 tablet by mouth daily.    [provider]    Allergies Patient has no known allergies.    Social History Social History   Tobacco Use  . Smoking status: Current Every Day Smoker    Packs/day: 1.00    Types: Cigarettes  . Smokeless tobacco: Never Used  Substance Use Topics  . Alcohol use: No  . Drug use: No    Review of Systems Patient denies headaches, rhinorrhea, blurry vision, numbness, shortness of breath, chest pain, edema, cough, abdominal pain, nausea, vomiting,  diarrhea, dysuria, fevers, rashes or hallucinations unless otherwise stated above in HPI. ____________________________________________   PHYSICAL EXAM:  VITAL SIGNS: Vitals:   08/15/19 1353  BP: (!) 152/98  Pulse: 89  Resp: 17  Temp: 97.6 F (36.4 C)  SpO2: 100%    Constitutional: Alert and oriented.  Eyes: Conjunctivae are normal.  Head: Atraumatic. Nose: No congestion/rhinnorhea. Mouth/Throat: Mucous membranes are moist.   Neck: No stridor. Painless ROM.  Cardiovascular: Normal rate, regular rhythm. Grossly normal heart sounds.  Good peripheral circulation. Respiratory: Normal respiratory effort.  No retractions. Lungs CTAB. Gastrointestinal: Soft and nontender. No distention. No abdominal bruits. No CVA tenderness. Genitourinary: No cervical motion tenderness there is white discharge with some vaginal irritation consistent with candidiasis. Musculoskeletal: No lower extremity tenderness nor edema.  No joint effusions. Neurologic:  Normal speech and language. No gross focal neurologic deficits are appreciated. No facial droop Skin:  Skin is warm, dry and intact. No rash noted. Psychiatric: Mood and affect are normal. Speech and behavior are normal.  ____________________________________________   LABS (all labs ordered are listed, but only abnormal results are displayed)  Results for orders placed or performed during the hospital encounter of 08/15/19 (from the past 24 hour(s))  Urinalysis, Routine w reflex microscopic     Status: Abnormal   Collection Time: 08/15/19  1:57 PM  Result Value Ref Range   Color, Urine YELLOW (A) YELLOW   APPearance HAZY (  A) CLEAR   Specific Gravity, Urine 1.014 1.005 - 1.030   pH 5.0 5.0 - 8.0   Glucose, UA NEGATIVE NEGATIVE mg/dL   Hgb urine dipstick NEGATIVE NEGATIVE   Bilirubin Urine NEGATIVE NEGATIVE   Ketones, ur NEGATIVE NEGATIVE mg/dL   Protein, ur NEGATIVE NEGATIVE mg/dL   Nitrite NEGATIVE NEGATIVE   Leukocytes,Ua NEGATIVE  NEGATIVE  hCG, quantitative, pregnancy     Status: Abnormal   Collection Time: 08/15/19  1:57 PM  Result Value Ref Range   hCG, Beta Chain, Quant, S 49,707 (H) <5 mIU/mL  Wet prep, genital     Status: Abnormal   Collection Time: 08/15/19  4:23 PM  Result Value Ref Range   Yeast Wet Prep HPF POC NONE SEEN NONE SEEN   Trich, Wet Prep NONE SEEN NONE SEEN   Clue Cells Wet Prep HPF POC NONE SEEN NONE SEEN   WBC, Wet Prep HPF POC FEW (A) NONE SEEN   Sperm NONE SEEN    ____________________________________________  ____________________________________________  RADIOLOGY  I personally reviewed all radiographic images ordered to evaluate for the above acute complaints and reviewed radiology reports and findings.  These findings were personally discussed with the patient.  Please see medical record for radiology report.  ____________________________________________   PROCEDURES  Procedure(s) performed:  Procedures    Critical Care performed: no ____________________________________________   INITIAL IMPRESSION / ASSESSMENT AND PLAN / ED COURSE  Pertinent labs & imaging results that were available during my care of the patient were reviewed by me and considered in my medical decision making (see chart for details).   DDX: Vaginitis, vaginosis, UTI, BV  Mary Gilbert is a 28 y.o. who presents to the ED with symptoms consistent with vaginal candidiasis.  Abdominal exam otherwise soft and benign.  Patient requesting repeat ultrasound and given her pelvic discomfort I think that is reasonable.  Ultrasound shows live IUP and twin gestation.  Patient stable for outpatient follow-up.     The patient was evaluated in Emergency Department today for the symptoms described in the history of present illness. He/she was evaluated in the context of the global COVID-19 pandemic, which necessitated consideration that the patient might be at risk for infection with the SARS-CoV-2 virus that causes  COVID-19. Institutional protocols and algorithms that pertain to the evaluation of patients at risk for COVID-19 are in a state of rapid change based on information released by regulatory bodies including the CDC and federal and state organizations. These policies and algorithms were followed during the patient's care in the ED.  As part of my medical decision making, I reviewed the following data within the [place] notes reviewed and incorporated, Labs reviewed, notes from prior ED visits and [place] Controlled Substance Database   ____________________________________________   FINAL CLINICAL IMPRESSION(S) / ED DIAGNOSES  Final diagnoses:  Pelvic pain  Vaginal itching      NEW MEDICATIONS STARTED DURING THIS VISIT:  New Prescriptions   CLOTRIMAZOLE (GYNE-LOTRIMIN) 1 % VAGINAL CREAM    Place 1 Applicatorful vaginally at bedtime for 7 days.     Note:  This document was prepared using Dragon voice recognition software and may include unintentional dictation errors.    Merlyn Lot, MD 08/15/19 [phone]

## 2019-08-15 NOTE — ED Notes (Signed)
Pt alert and oriented X 4, stable for discharge. RR even and unlabored, color WNL. Discussed discharge instructions and follow up when appropriate. Instructed to follow up with ER for any life threatening symptoms or concerns that patient or family of patient may have  

## 2019-08-15 NOTE — ED Notes (Signed)
Pt in ultrasound

## 2019-08-19 LAB — GC/CHLAMYDIA PROBE AMP
Chlamydia trachomatis, NAA: NEGATIVE
Neisseria Gonorrhoeae by PCR: NEGATIVE

## 2019-08-22 ENCOUNTER — Other Ambulatory Visit: Payer: Self-pay

## 2019-08-22 ENCOUNTER — Encounter: Payer: Self-pay | Admitting: Obstetrics & Gynecology

## 2019-08-22 ENCOUNTER — Other Ambulatory Visit (HOSPITAL_COMMUNITY)
Admission: RE | Admit: 2019-08-22 | Discharge: 2019-08-22 | Disposition: A | Discharge status: Home / Self Care | Payer: Medicaid Other | Source: Ambulatory Visit | Attending: Obstetrics & Gynecology | Admitting: Obstetrics & Gynecology

## 2019-08-22 ENCOUNTER — Ambulatory Visit (INDEPENDENT_AMBULATORY_CARE_PROVIDER_SITE_OTHER): Payer: Medicaid Other | Admitting: Obstetrics & Gynecology

## 2019-08-22 VITALS — BP 120/80 | Wt 277.0 lb

## 2019-08-22 DIAGNOSIS — O0991 Supervision of high risk pregnancy, unspecified, first trimester: Secondary | ICD-10-CM | POA: Diagnosis not present

## 2019-08-22 DIAGNOSIS — O99211 Obesity complicating pregnancy, first trimester: Secondary | ICD-10-CM | POA: Diagnosis not present

## 2019-08-22 DIAGNOSIS — O0990 Supervision of high risk pregnancy, unspecified, unspecified trimester: Secondary | ICD-10-CM

## 2019-08-22 DIAGNOSIS — Z124 Encounter for screening for malignant neoplasm of cervix: Secondary | ICD-10-CM | POA: Insufficient documentation

## 2019-08-22 DIAGNOSIS — Z131 Encounter for screening for diabetes mellitus: Secondary | ICD-10-CM

## 2019-08-22 DIAGNOSIS — O10911 Unspecified pre-existing hypertension complicating pregnancy, first trimester: Secondary | ICD-10-CM

## 2019-08-22 DIAGNOSIS — IMO0002 Reserved for concepts with insufficient information to code with codable children: Secondary | ICD-10-CM | POA: Insufficient documentation

## 2019-08-22 DIAGNOSIS — O10919 Unspecified pre-existing hypertension complicating pregnancy, unspecified trimester: Secondary | ICD-10-CM | POA: Insufficient documentation

## 2019-08-22 DIAGNOSIS — Z3A01 Less than 8 weeks gestation of pregnancy: Secondary | ICD-10-CM | POA: Diagnosis not present

## 2019-08-22 NOTE — Patient Instructions (Signed)
First Trimester of Pregnancy The first trimester of pregnancy is from week 1 until the end of week 13 (months 1 through 3). A week after a sperm fertilizes an egg, the egg will implant on the wall of the uterus. This embryo will begin to develop into a baby. Genes from you and your partner will form the baby. The female genes will determine whether the baby will be a boy or a girl. At 6-8 weeks, the eyes and face will be formed, and the heartbeat can be seen on ultrasound. At the end of 12 weeks, all the baby's organs will be formed. Now that you are pregnant, you will want to do everything you can to have a healthy baby. Two of the most important things are to get good prenatal care and to follow your health care provider's instructions. Prenatal care is all the medical care you receive before the baby's birth. This care will help prevent, find, and treat any problems during the pregnancy and childbirth. Body changes during your first trimester Your body goes through many changes during pregnancy. The changes vary from woman to woman.  You may gain or lose a couple of pounds at first.  You may feel sick to your stomach (nauseous) and you may throw up (vomit). If the vomiting is uncontrollable, call your health care provider.  You may tire easily.  You may develop headaches that can be relieved by medicines. All medicines should be approved by your health care provider.  You may urinate more often. Painful urination may mean you have a bladder infection.  You may develop heartburn as a result of your pregnancy.  You may develop constipation because certain hormones are causing the muscles that push stool through your intestines to slow down.  You may develop hemorrhoids or swollen veins (varicose veins).  Your breasts may begin to grow larger and become tender. Your nipples may stick out more, and the tissue that surrounds them (areola) may become darker.  Your gums may bleed and may be  sensitive to brushing and flossing.  Dark spots or blotches (chloasma, mask of pregnancy) may develop on your face. This will likely fade after the baby is born.  Your menstrual periods will stop.  You may have a loss of appetite.  You may develop cravings for certain kinds of food.  You may have changes in your emotions from day to day, such as being excited to be pregnant or being concerned that something may go wrong with the pregnancy and baby.  You may have more vivid and strange dreams.  You may have changes in your hair. These can include thickening of your hair, rapid growth, and changes in texture. Some women also have hair loss during or after pregnancy, or hair that feels dry or thin. Your hair will most likely return to normal after your baby is born. What to expect at prenatal visits During a routine prenatal visit:  You will be weighed to make sure you and the baby are growing normally.  Your blood pressure will be taken.  Your abdomen will be measured to track your baby's growth.  The fetal heartbeat will be listened to between weeks 10 and 14 of your pregnancy.  Test results from any previous visits will be discussed. Your health care provider may ask you:  How you are feeling.  If you are feeling the baby move.  If you have had any abnormal symptoms, such as leaking fluid, bleeding, severe headaches, or abdominal   cramping.  If you are using any tobacco products, including cigarettes, chewing tobacco, and electronic cigarettes.  If you have any questions. Other tests that may be performed during your first trimester include:  Blood tests to find your blood type and to check for the presence of any previous infections. The tests will also be used to check for low iron levels (anemia) and protein on red blood cells (Rh antibodies). Depending on your risk factors, or if you previously had diabetes during pregnancy, you may have tests to check for high blood sugar  that affects pregnant women (gestational diabetes).  Urine tests to check for infections, diabetes, or protein in the urine.  An ultrasound to confirm the proper growth and development of the baby.  Fetal screens for spinal cord problems (spina bifida) and Down syndrome.  HIV (human immunodeficiency virus) testing. Routine prenatal testing includes screening for HIV, unless you choose not to have this test.  You may need other tests to make sure you and the baby are doing well. Follow these instructions at home: Medicines  Follow your health care provider's instructions regarding medicine use. Specific medicines may be either safe or unsafe to take during pregnancy.  Take a prenatal vitamin that contains at least 600 micrograms (mcg) of folic acid.  If you develop constipation, try taking a stool softener if your health care provider approves. Eating and drinking   Eat a balanced diet that includes fresh fruits and vegetables, whole grains, good sources of protein such as meat, eggs, or tofu, and low-fat dairy. Your health care provider will help you determine the amount of weight gain that is right for you.  Avoid raw meat and uncooked cheese. These carry germs that can cause birth defects in the baby.  Eating four or five small meals rather than three large meals a day may help relieve nausea and vomiting. If you start to feel nauseous, eating a few soda crackers can be helpful. Drinking liquids between meals, instead of during meals, also seems to help ease nausea and vomiting.  Limit foods that are high in fat and processed sugars, such as fried and sweet foods.  To prevent constipation: ? Eat foods that are high in fiber, such as fresh fruits and vegetables, whole grains, and beans. ? Drink enough fluid to keep your urine clear or pale yellow. Activity  Exercise only as directed by your health care provider. Most women can continue their usual exercise routine during  pregnancy. Try to exercise for 30 minutes at least 5 days a week. Exercising will help you: ? Control your weight. ? Stay in shape. ? Be prepared for labor and delivery.  Experiencing pain or cramping in the lower abdomen or lower back is a good sign that you should stop exercising. Check with your health care provider before continuing with normal exercises.  Try to avoid standing for long periods of time. Move your legs often if you must stand in one place for a long time.  Avoid heavy lifting.  Wear low-heeled shoes and practice good posture.  You may continue to have sex unless your health care provider tells you not to. Relieving pain and discomfort  Wear a good support bra to relieve breast tenderness.  Take warm sitz baths to soothe any pain or discomfort caused by hemorrhoids. Use hemorrhoid cream if your health care provider approves.  Rest with your legs elevated if you have leg cramps or low back pain.  If you develop varicose veins in   your legs, wear support hose. Elevate your feet for 15 minutes, 3-4 times a day. Limit salt in your diet. Prenatal care  Schedule your prenatal visits by the twelfth week of pregnancy. They are usually scheduled monthly at first, then more often in the last 2 months before delivery.  Write down your questions. Take them to your prenatal visits.  Keep all your prenatal visits as told by your health care provider. This is important. Safety  Wear your seat belt at all times when driving.  Make a list of emergency phone numbers, including numbers for family, friends, the hospital, and police and fire departments. General instructions  Ask your health care provider for a referral to a local prenatal education class. Begin classes no later than the beginning of month 6 of your pregnancy.  Ask for help if you have counseling or nutritional needs during pregnancy. Your health care provider can offer advice or refer you to specialists for help  with various needs.  Do not use hot tubs, steam rooms, or saunas.  Do not douche or use tampons or scented sanitary pads.  Do not cross your legs for long periods of time.  Avoid cat litter boxes and soil used by cats. These carry germs that can cause birth defects in the baby and possibly loss of the fetus by miscarriage or stillbirth.  Avoid all smoking, herbs, alcohol, and medicines not prescribed by your health care provider. Chemicals in these products affect the formation and growth of the baby.  Do not use any products that contain nicotine or tobacco, such as cigarettes and e-cigarettes. If you need help quitting, ask your health care provider. You may receive counseling support and other resources to help you quit.  Schedule a dentist appointment. At home, brush your teeth with a soft toothbrush and be gentle when you floss. Contact a health care provider if:  You have dizziness.  You have mild pelvic cramps, pelvic pressure, or nagging pain in the abdominal area.  You have persistent nausea, vomiting, or diarrhea.  You have a bad smelling vaginal discharge.  You have pain when you urinate.  You notice increased swelling in your face, hands, legs, or ankles.  You are exposed to fifth disease or chickenpox.  You are exposed to German measles (rubella) and have never had it. Get help right away if:  You have a fever.  You are leaking fluid from your vagina.  You have spotting or bleeding from your vagina.  You have severe abdominal cramping or pain.  You have rapid weight gain or loss.  You vomit blood or material that looks like coffee grounds.  You develop a severe headache.  You have shortness of breath.  You have any kind of trauma, such as from a fall or a car accident. Summary  The first trimester of pregnancy is from week 1 until the end of week 13 (months 1 through 3).  Your body goes through many changes during pregnancy. The changes vary from  woman to woman.  You will have routine prenatal visits. During those visits, your health care provider will examine you, discuss any test results you may have, and talk with you about how you are feeling. This information is not intended to replace advice given to you by your health care provider. Make sure you discuss any questions you have with your health care provider. Document Released: 12/08/2001 Document Revised: 11/26/2017 Document Reviewed: 11/25/2016 Elsevier Patient Education  2020 [employer]      COVID-19 and Your Pregnancy FAQ  How can I prevent infection with COVID-19 during my pregnancy? Social distancing is key. Please limit any interactions in public. Try and work from home if possible. Frequently wash your hands after touching possibly contaminated surfaces. Avoid touching your face.  Minimize trips to the store. Consider online ordering when possible.   Should I wear a mask? YES. It is recommended by the CDC that all people wear a cloth mask or facial covering in public. You should wear a mask to your visits in the office. This will help reduce transmission as well as your risk or acquiring COVID-19. New studies are showing that even asymptomatic individuals can spread the virus from talking.   Where can I get a mask? [place] and the city of [place] are partnering to provide masks to community members. You can pick up a mask from several locations. This website also has instructions about how to make a mask by sewing or without sewing by using a t-shirt or bandana.  https://www.[place].gov/i-want-to/learn-about/covid-19-information-and-updates/covid-19-face-mask-project  Studies have shown that if you were a tube or nylon stocking from pantyhose over a cloth mask it makes the cloth mask almost as effective as a N95 mask.   https://www.npr.org/sections/[place]/2019/04/19/840146830/adding-a-nylon-stocking-layer-could-boost-protection-from-cloth-masks-study-find  What are the symptoms of COVID-19? Fever (greater than 100.4 F), dry cough, shortness of breath.  Am I more at risk for COVID-19 since I am pregnant? There is not currently data showing that pregnant women are more adversely impacted by COVID-19 than the general population. However, we know that pregnant women tend to have worse respiratory complications from similar diseases such as the flu and SARS and for this reason should be considered an at-risk population.  What do I do if I am experiencing the symptoms of COVID-19? Testing is being limited because of test availability. If you are experiencing symptoms you should quarantine yourself, and the members of your family, for at least 2 weeks at home.   Please visit this website for more information: https://www.cdc.gov/coronavirus/2019-ncov/if-you-are-sick/steps-when-sick.html  When should I go to the Emergency Room? Please go to the emergency room if you are experiencing ANY of these symptoms*:  1.    Difficulty breathing or shortness of breath 2.    Persistent pain or pressure in the chest 3.    Confusion or difficulty being aroused (or awakened) 4.    Bluish lips or face  *This list is not all inclusive. Please consult our office for any other symptoms that are severe or concerning.  What do I do if I am having difficulty breathing? You should go to the Emergency Room for evaluation. At this time they have a tent set up for evaluating patients with COVID-19 symptoms.   How will my prenatal care be different because of the COVID-19 pandemic? It has been recommended to reduce the frequency of face-to-face visits and use resources such as telephone and virtual visits when possible. Using a scale, blood pressure machine and fetal doppler at home can further help reduce face-to-face visits. You  will be provided with additional information on this topic.  We ask that you come to your visits alone to minimize potential exposures to  COVID-19.  How can I receive childbirth education? At this time in-person classes have been cancelled. You can register for online childbirth education, breastfeeding, and newborn care classes.  Please visit:  [email or web address] for more information  How will my hospital birth experience be different? The hospital is currently limiting visitors. This means that while you are in   labor you can only have one person at the hospital with you. Additional family members will not be allowed to wait in the building or outside your room. Your one support person can be the father of the baby, a relative, a doula, or a friend. Once one support person is designated that person will wear a band. This band cannot be shared with multiple people.  Nitrous Gas is not being offered for pain relief since the tubing and filter for the machine can not be sanitized in a way to guarantee prevention of transmission of COVID-19.  Nasal cannula use of oxygen for fetal indications has also been discontinued.  Currently a clear plastic sheet is being hung between mom and the delivering provider during pushing and delivery to help prevent transmission of COVID-19.      How long will I stay in the hospital for after giving birth? It is also recommended that discharge home be expedited during the COVID-19 outbreak. This means staying for 1 day after a vaginal delivery and 2 days after a cesarean section. Patients who need to stay longer for medical reasons are allowed to do so, but the goal will be for expedited discharge home.   What if I have COVID-19 and I am in labor? We ask that you wear a mask while on labor and delivery. We will try and accommodate you being placed in a room that is capable of filtering the air. Please call ahead if you are in labor and on your way to the  hospital. The phone number for labor and delivery at [facility] is [phone].  If I have COVID-19 when my baby is born how can I prevent my baby from contracting COVID-19? This is an issue that will have to be discussed on a case-by-case basis. Current recommendations suggest providing separate isolation rooms for both the mother and new infant as well as limiting visitors. However, there are practical challenges to this recommendation. The situation will assuredly change and decisions will be influenced by the desires of the mother and availability of space.  Some suggestions are the use of a curtain or physical barrier between mom and infant, hand hygiene, mom wearing a mask, or 6 feet of spacing between a mom and infant.   Can I breastfeed during the COVID-19 pandemic?   Yes, breastfeeding is encouraged.  Can I breastfeed if I have COVID-19? Yes. Covid-19 has not been found in breast milk. This means you cannot give COVID-19 to your child through breast milk. Breast feeding will also help pass antibodies to fight infection to your baby.   What precautions should I take when breastfeeding if I have COVID-19? If a mother and newborn do room-in and the mother wishes to feed at the breast, she should put on a facemask and practice hand hygiene before each feeding.  What precautions should I take when pumping if I have COVID-19? Prior to expressing breast milk, mothers should practice hand hygiene. After each pumping session, all parts that come into contact with breast milk should be thoroughly washed and the entire pump should be appropriately disinfected per the manufacturer's instructions. This expressed breast milk should be fed to the newborn by a healthy caregiver.  What if I am pregnant and work in healthcare? Based on limited data regarding COVID-19 and pregnancy, ACOG currently does not propose creating additional restrictions on pregnant health care personnel  because of COVID-19 alone. Pregnant women do not appear to be at higher   risk of severe disease related to COVID-19. Pregnant health care personnel should follow CDC risk assessment and infection control guidelines for health care personnel exposed to patients with suspected or confirmed COVID-19. Adherence to recommended infection prevention and control practices is an important part of protecting all health care personnel in health care settings.    Information on COVID-19 in pregnancy is very limited; however, facilities may want to consider limiting exposure of pregnant health care personnel to patients with confirmed or suspected COVID-19 infection, especially during higher-risk procedures (eg, aerosol-generating procedures), if feasible, based on staffing availability.     

## 2019-08-22 NOTE — Progress Notes (Signed)
08/22/2019   Chief Complaint: Missed period  Transfer of Care Patient: no  History of Present Illness: [name] is a 28 y.o. G3P0020 6368w3d based on Patient's last menstrual period was 06/26/2019. with an Estimated Date of Delivery: 04/06/20, with the above CC.   Her periods were: regular periods every 28 days She was using no method when she conceived. She had actually started OCPs in July for one week then stopped when took preg test She has Positive signs or symptoms of nausea/vomiting of pregnancy. She has Negative signs or symptoms of miscarriage or preterm labor She identifies Negative Zika risk factors for her and her partner On any different medications around the time she conceived/early pregnancy: Yes - Norvasc for HTN, now stopped History of varicella: Yes   ROS: A 12-point review of systems was performed and negative, except as stated in the above HPI.  OBGYN History: As per HPI. OB History  Gravida Para Term Preterm AB Living  3       2    SAB TAB Ectopic Multiple Live Births  2     1      # Outcome Date GA Lbr Len/2nd Weight Sex Delivery Anes PTL Lv  3A Gravida           3B Current           2 SAB           1 SAB             Any issues with any prior pregnancies: miscarriage x2 early, 6 years ago Any prior children are healthy, doing well, without any problems or issues: not applicable History of pap smears: Yes. Last pap smear 2019. Abnormal: yes  History of STIs: No   Past Medical History: Past Medical History:  Diagnosis Date  . Hypertension   . Kidney stones   . UTI (urinary tract infection)     Past Surgical History: Past Surgical History:  Procedure Laterality Date  . CHOLECYSTECTOMY    . LITHOTRIPSY      Family History:  Family History  Problem Relation Age of Onset  . Mental illness Mother   . Heart disease Father   . Hypertension Father    She denies any female cancers, bleeding or blood clotting disorders.  She denies any history of  mental retardation, birth defects or genetic disorders in her or the FOB's history  Social History:  Social History   Socioeconomic History  . Marital status: Single    Spouse name: Not on file  . Number of children: Not on file  . Years of education: Not on file  . Highest education level: Not on file  Occupational History  . Not on file  Social Needs  . Financial resource strain: Not on file  . Food insecurity    Worry: Not on file    Inability: Not on file  . Transportation needs    Medical: Not on file    Non-medical: Not on file  Tobacco Use  . Smoking status: Current Every Day Smoker    Packs/day: 1.00    Types: Cigarettes  . Smokeless tobacco: Never Used  Substance and Sexual Activity  . Alcohol use: No  . Drug use: No  . Sexual activity: Yes  Lifestyle  . Physical activity    Days per week: Not on file    Minutes per session: Not on file  . Stress: Not on file  Relationships  . Social connections  Talks on phone: Not on file    Gets together: Not on file    Attends religious service: Not on file    Active member of club or organization: Not on file    Attends meetings of clubs or organizations: Not on file    Relationship status: Not on file  . Intimate partner violence    Fear of current or ex partner: Not on file    Emotionally abused: Not on file    Physically abused: Not on file    Forced sexual activity: Not on file  Other Topics Concern  . Not on file  Social History Narrative  . Not on file   Any pets in the household: no  Allergy: No Known Allergies  Current Outpatient Medications:  Current Outpatient Medications:  Marland Kitchen  Multiple Vitamin (MULTIVITAMIN WITH MINERALS) TABS tablet, Take 1 tablet by mouth daily., Disp: , Rfl:  .  amLODipine (NORVASC) 5 MG tablet, Take 5 mg by mouth daily., Disp: , Rfl:    Physical Exam:   BP 120/80   Wt 277 lb (125.6 kg)   LMP 06/26/2019   Breastfeeding Unknown   BMI 44.71 kg/m  Body mass index is  44.71 kg/m. Constitutional: Well nourished, well developed female in no acute distress.  Neck:  Supple, normal appearance, and no thyromegaly  Cardiovascular: S1, S2 normal, no murmur, rub or gallop, regular rate and rhythm Respiratory:  Clear to auscultation bilateral. Normal respiratory effort Abdomen: positive bowel sounds and no masses, hernias; diffusely non tender to palpation, non distended Breasts: breasts appear normal, no suspicious masses, no skin or nipple changes or axillary nodes. Neuro/Psych:  Normal mood and affect.  Skin:  Warm and dry.  Lymphatic:  No inguinal lymphadenopathy.   Pelvic exam: is not limited by body habitus EGBUS: within normal limits, Vagina: within normal limits and with no blood in the vault, Cervix: normal appearing cervix without discharge or lesions, closed/long/high, Uterus:  enlarged: 8 weeks, and Adnexa:  no mass, fullness, tenderness  Assessment: [name] is a 28 y.o. G3P0020 [redacted]w[redacted]d based on Patient's last menstrual period was 06/26/2019. with an Estimated Date of Delivery: 04/06/20,  for prenatal care.  Plan:  1) Avoid alcoholic beverages. 2) Patient encouraged not to smoke.  3) Discontinue the use of all non-medicinal drugs and chemicals.  4) Take prenatal vitamins daily.  5) Seatbelt use advised 6) Nutrition, food safety (fish, cheese advisories, and high nitrite foods) and exercise discussed. 7) Hospital and practice style delivering at [facility] discussed  8) Patient is asked about travel to areas at risk for the [place] virus, and counseled to avoid travel and exposure to mosquitoes or sexual partners who may have themselves been exposed to the virus. Testing is discussed, and will be ordered as appropriate.  9) Childbirth classes at [facility] advised 10) Genetic Screening, such as with 1st Trimester Screening, cell free fetal DNA, AFP testing, and Ultrasound, as well as with amniocentesis and CVS as appropriate, is discussed with patient. She plans to  consider genetic testing this pregnancy. 11) HROB Status discussed CHTN, Obesity, Twins  [facility] 04/06/2020 based on 08/15/19 [place] and twin A (Twin B was 04/09/20, will go with earlier due date) [place] soon for follow up; also glucola Baby ASA daily (discuss and start at next visit) APT, Growth [place], Labor planning  Problem list reviewed and updated.  Barnett Applebaum, MD, [facility], [facility] 08/22/2019  2:29 PM

## 2019-08-23 LAB — RPR+RH+ABO+RUB AB+AB SCR+CB...
Antibody Screen: NEGATIVE
HIV Screen 4th Generation wRfx: NONREACTIVE
Hematocrit: 42.8 % (ref 34.0–46.6)
Hemoglobin: 14.6 g/dL (ref 11.1–15.9)
Hepatitis B Surface Ag: NEGATIVE
MCH: 30.7 pg (ref 26.6–33.0)
MCHC: 34.1 g/dL (ref 31.5–35.7)
MCV: 90 fL (ref 79–97)
Platelets: 250 10*3/uL (ref 150–450)
RBC: 4.76 x10E6/uL (ref 3.77–5.28)
RDW: 12.6 % (ref 11.7–15.4)
RPR Ser Ql: NONREACTIVE
Rh Factor: POSITIVE
Rubella Antibodies, IGG: 3.23 index (ref >0.99)
Varicella zoster IgG: 174 index (ref >165)
WBC: 13.5 10*3/uL — ABNORMAL HIGH (ref 3.4–10.8)

## 2019-08-23 LAB — DRUG SCREEN, URINE
Amphetamines, Urine: NEGATIVE ng/mL
Barbiturate screen, urine: NEGATIVE ng/mL
Benzodiazepine Quant, Ur: NEGATIVE ng/mL
Cannabinoid Quant, Ur: NEGATIVE ng/mL
Cocaine (Metab.): NEGATIVE ng/mL
Opiate Quant, Ur: NEGATIVE ng/mL
PCP Quant, Ur: NEGATIVE ng/mL

## 2019-08-24 LAB — URINE CULTURE: Organism ID, Bacteria: NO GROWTH

## 2019-08-25 LAB — CYTOLOGY - PAP
Chlamydia: NEGATIVE
Neisseria Gonorrhea: NEGATIVE
Trichomonas: NEGATIVE

## 2019-08-28 ENCOUNTER — Telehealth: Payer: Self-pay | Admitting: Obstetrics & Gynecology

## 2019-08-28 NOTE — Progress Notes (Signed)
Schedule colpo to discuss PAP results and further evaluate

## 2019-08-28 NOTE — Telephone Encounter (Signed)
-----   Message from Gae Dry, MD sent at 08/28/2019  6:45 AM EDT ----- Schedule colpo to discuss PAP results and further evaluate

## 2019-08-28 NOTE — Telephone Encounter (Signed)
Patient aware of schedule appointments for Colpo. Awaiting results. Please advise

## 2019-08-29 ENCOUNTER — Telehealth: Payer: Self-pay

## 2019-08-29 NOTE — Telephone Encounter (Signed)
Spoke w/pt. She denies vaginal bleeding. States pain is constant & she is concerned d/t having 2 prior miscarriages. Advised will send to [facility] for review.

## 2019-08-29 NOTE — Telephone Encounter (Signed)
Spoke w/pt. Advised 3:30apt tomorrow w/[facility] in [place] (given directions to building & [address]).

## 2019-08-29 NOTE — Telephone Encounter (Signed)
Patient states she is having pain on her right side lower stomach (not intense) just a little uncomfortable. [phone]

## 2019-08-30 ENCOUNTER — Ambulatory Visit (INDEPENDENT_AMBULATORY_CARE_PROVIDER_SITE_OTHER): Payer: Medicaid Other | Admitting: Obstetrics and Gynecology

## 2019-08-30 ENCOUNTER — Other Ambulatory Visit: Payer: Self-pay

## 2019-08-30 VITALS — Wt 277.0 lb

## 2019-08-30 DIAGNOSIS — O0990 Supervision of high risk pregnancy, unspecified, unspecified trimester: Secondary | ICD-10-CM

## 2019-08-30 DIAGNOSIS — IMO0002 Reserved for concepts with insufficient information to code with codable children: Secondary | ICD-10-CM

## 2019-08-30 DIAGNOSIS — O0991 Supervision of high risk pregnancy, unspecified, first trimester: Secondary | ICD-10-CM

## 2019-08-30 DIAGNOSIS — Z3A08 8 weeks gestation of pregnancy: Secondary | ICD-10-CM

## 2019-08-30 DIAGNOSIS — O99211 Obesity complicating pregnancy, first trimester: Secondary | ICD-10-CM

## 2019-08-30 DIAGNOSIS — O10919 Unspecified pre-existing hypertension complicating pregnancy, unspecified trimester: Secondary | ICD-10-CM

## 2019-08-30 DIAGNOSIS — O10911 Unspecified pre-existing hypertension complicating pregnancy, first trimester: Secondary | ICD-10-CM

## 2019-08-30 DIAGNOSIS — R300 Dysuria: Secondary | ICD-10-CM

## 2019-08-30 LAB — POCT URINALYSIS DIPSTICK
Bilirubin, UA: NEGATIVE
Blood, UA: NEGATIVE
Glucose, UA: NEGATIVE
Ketones, UA: POSITIVE
Nitrite, UA: NEGATIVE
Protein, UA: NEGATIVE
Spec Grav, UA: 1.01 (ref 1.010–1.025)
Urobilinogen, UA: NEGATIVE E.U./dL — AB
pH, UA: 6 (ref 5.0–8.0)

## 2019-08-30 NOTE — Progress Notes (Signed)
ROB Burning with urination/no bleeding Low abdomen cramping

## 2019-08-30 NOTE — Progress Notes (Signed)
    Routine Prenatal Care Visit  Subjective  Mary Gilbert is a 28 y.o. G3P0020 at [redacted]w[redacted]d being seen today for ongoing prenatal care.  She is currently monitored for the following issues for this high-risk pregnancy and has Chronic hypertension affecting pregnancy; Obesity affecting pregnancy in first trimester; Dizygotic twins; and Supervision of high risk pregnancy, antepartum on their problem list.  ----------------------------------------------------------------------------------- Patient reports right lower quadrant pain.    . Vag. Bleeding: None.  Movement: Absent. Denies leaking of fluid.  ----------------------------------------------------------------------------------- The following portions of the patient's history were reviewed and updated as appropriate: allergies, current medications, past family history, past medical history, past social history, past surgical history and problem list. Problem list updated.   Objective  Weight 277 lb (125.6 kg), last menstrual period 06/26/2019, unknown if currently breastfeeding. Pregravid weight 272 lb (123.4 kg) Total Weight Gain 5 lb (2.268 kg) Urinalysis:      Fetal Status: Fetal Heart Rate (bpm): 171   Movement: Absent     General:  Alert, oriented and cooperative. Patient is in no acute distress.  Skin: Skin is warm and dry. No rash noted.   Cardiovascular: Normal heart rate noted  Respiratory: Normal respiratory effort, no problems with respiration noted  Abdomen: Soft, gravid, appropriate for gestational age. Pain/Pressure: Present     Pelvic:  Cervical exam deferred        Extremities: Normal range of motion.     ental Status: Normal mood and affect. Normal behavior. Normal judgment and thought content.   Bedside TVUS A CRL 8w2 and B [redacted]w[redacted]d A FHT 150 and B FHT 171.  There was significant size discrepancy in the gestational sacs with the gestational sac of fetus A appearing appropriate and gestational sac of fetus B significantly  smaller.    Assessment   28 y.o. G3P0020 at [redacted]w[redacted]d by  04/06/2020, by Ultrasound presenting for work-in prenatal visit  Plan    Gestational age appropriate obstetric precautions including but not limited to vaginal bleeding, contractions, leaking of fluid and fetal movement were reviewed in detail with the patient.    Return in about 1 week (around 09/06/2019) for ROb Mary Gilbert.  Malachy Mood, MD, [facility], [facility] 08/30/2019, 7:47 PM

## 2019-09-01 LAB — URINE CULTURE: Organism ID, Bacteria: NO GROWTH

## 2019-09-06 ENCOUNTER — Emergency Department
Admission: EM | Admit: 2019-09-06 | Discharge: 2019-09-06 | Disposition: A | Discharge status: Home / Self Care | Payer: Medicaid Other | Attending: Emergency Medicine | Admitting: Emergency Medicine

## 2019-09-06 ENCOUNTER — Ambulatory Visit (INDEPENDENT_AMBULATORY_CARE_PROVIDER_SITE_OTHER): Payer: Medicaid Other

## 2019-09-06 ENCOUNTER — Encounter: Payer: Medicaid Other | Admitting: Obstetrics and Gynecology

## 2019-09-06 ENCOUNTER — Other Ambulatory Visit: Payer: Self-pay

## 2019-09-06 ENCOUNTER — Ambulatory Visit (INDEPENDENT_AMBULATORY_CARE_PROVIDER_SITE_OTHER): Payer: Medicaid Other | Admitting: Obstetrics and Gynecology

## 2019-09-06 ENCOUNTER — Other Ambulatory Visit: Payer: Medicaid Other

## 2019-09-06 ENCOUNTER — Encounter: Payer: Self-pay | Admitting: Obstetrics and Gynecology

## 2019-09-06 ENCOUNTER — Encounter: Payer: Self-pay | Admitting: Emergency Medicine

## 2019-09-06 VITALS — BP 124/82 | Ht 66.0 in | Wt 274.0 lb

## 2019-09-06 DIAGNOSIS — O0990 Supervision of high risk pregnancy, unspecified, unspecified trimester: Secondary | ICD-10-CM

## 2019-09-06 DIAGNOSIS — O99331 Smoking (tobacco) complicating pregnancy, first trimester: Secondary | ICD-10-CM | POA: Diagnosis not present

## 2019-09-06 DIAGNOSIS — IMO0002 Reserved for concepts with insufficient information to code with codable children: Secondary | ICD-10-CM

## 2019-09-06 DIAGNOSIS — O10911 Unspecified pre-existing hypertension complicating pregnancy, first trimester: Secondary | ICD-10-CM

## 2019-09-06 DIAGNOSIS — O09291 Supervision of pregnancy with other poor reproductive or obstetric history, first trimester: Secondary | ICD-10-CM | POA: Diagnosis not present

## 2019-09-06 DIAGNOSIS — F17210 Nicotine dependence, cigarettes, uncomplicated: Secondary | ICD-10-CM | POA: Insufficient documentation

## 2019-09-06 DIAGNOSIS — O0991 Supervision of high risk pregnancy, unspecified, first trimester: Secondary | ICD-10-CM

## 2019-09-06 DIAGNOSIS — O3481 Maternal care for other abnormalities of pelvic organs, first trimester: Secondary | ICD-10-CM

## 2019-09-06 DIAGNOSIS — Z131 Encounter for screening for diabetes mellitus: Secondary | ICD-10-CM

## 2019-09-06 DIAGNOSIS — O30041 Twin pregnancy, dichorionic/diamniotic, first trimester: Secondary | ICD-10-CM

## 2019-09-06 DIAGNOSIS — Z79899 Other long term (current) drug therapy: Secondary | ICD-10-CM | POA: Insufficient documentation

## 2019-09-06 DIAGNOSIS — Z3A08 8 weeks gestation of pregnancy: Secondary | ICD-10-CM | POA: Diagnosis not present

## 2019-09-06 DIAGNOSIS — N8311 Corpus luteum cyst of right ovary: Secondary | ICD-10-CM | POA: Diagnosis not present

## 2019-09-06 DIAGNOSIS — O99211 Obesity complicating pregnancy, first trimester: Secondary | ICD-10-CM

## 2019-09-06 DIAGNOSIS — Z3A09 9 weeks gestation of pregnancy: Secondary | ICD-10-CM | POA: Insufficient documentation

## 2019-09-06 DIAGNOSIS — I10 Essential (primary) hypertension: Secondary | ICD-10-CM | POA: Diagnosis not present

## 2019-09-06 DIAGNOSIS — O210 Mild hyperemesis gravidarum: Secondary | ICD-10-CM | POA: Insufficient documentation

## 2019-09-06 DIAGNOSIS — R1110 Vomiting, unspecified: Secondary | ICD-10-CM

## 2019-09-06 DIAGNOSIS — O218 Other vomiting complicating pregnancy: Secondary | ICD-10-CM

## 2019-09-06 DIAGNOSIS — O3110X0 Continuing pregnancy after spontaneous abortion of one fetus or more, unspecified trimester, not applicable or unspecified: Secondary | ICD-10-CM

## 2019-09-06 DIAGNOSIS — O219 Vomiting of pregnancy, unspecified: Secondary | ICD-10-CM | POA: Diagnosis present

## 2019-09-06 DIAGNOSIS — O10919 Unspecified pre-existing hypertension complicating pregnancy, unspecified trimester: Secondary | ICD-10-CM

## 2019-09-06 MED ORDER — VITAMIN B-6 50 MG PO TABS
25.0000 mg | ORAL_TABLET | Freq: Once | ORAL | Status: DC
  Filled 2019-09-06: qty 0.5

## 2019-09-06 MED ORDER — DOXYLAMINE SUCCINATE (SLEEP) 25 MG PO TABS
25.0000 mg | ORAL_TABLET | Freq: Once | ORAL | Status: DC
  Filled 2019-09-06: qty 1

## 2019-09-06 NOTE — Patient Instructions (Signed)
  Prenatal vitamin, 60-120mg  of elemental iron a day, 1mg  of folic acid  For Hyperemesis:

## 2019-09-06 NOTE — Progress Notes (Signed)
Routine Prenatal Care Visit  Subjective  Mary GoldHeather Gilbert is a 28 y.o. G3P0020 at 6828w6d being seen today for ongoing prenatal care.  She is currently monitored for the following issues for this high-risk pregnancy and has Chronic hypertension affecting pregnancy; Obesity affecting pregnancy in first trimester; Dizygotic twins; and Supervision of high risk pregnancy, antepartum on their problem list.  ----------------------------------------------------------------------------------- Patient reports no complaints.    .  .   . Denies leaking of fluid.  ----------------------------------------------------------------------------------- The following portions of the patient's history were reviewed and updated as appropriate: allergies, current medications, past family history, past medical history, past social history, past surgical history and problem list. Problem list updated.   Objective  Blood pressure 124/82, height 5\' 6"  (1.676 m), weight 274 lb (124.3 kg), last menstrual period 06/26/2019, unknown if currently breastfeeding. Pregravid weight 272 lb (123.4 kg) Total Weight Gain 5 lb (2.268 kg) Urinalysis:      Fetal Status:           General:  Alert, oriented and cooperative. Patient is in no acute distress.  Skin: Skin is warm and dry. No rash noted.   Cardiovascular: Normal heart rate noted  Respiratory: Normal respiratory effort, no problems with respiration noted  Abdomen: Soft, gravid, appropriate for gestational age.       Pelvic:  Cervical exam deferred        Extremities: Normal range of motion.     Mental Status: Normal mood and affect. Normal behavior. Normal judgment and thought content.     Assessment   28 y.o. G3P0020 at 7728w6d by  04/06/2020, by Ultrasound presenting for routine prenatal visit  Plan   pregnancy3 Problems (from 06/26/19 to present)    Problem Noted Resolved   Hyperemesis 09/08/2019 by Natale MilchSchuman, Devyn Griffing R, MD No   Vanishing twin syndrome  09/08/2019 by Natale MilchSchuman, Merian Wroe R, MD No   Chronic hypertension affecting pregnancy 08/22/2019 by Nadara MustardHarris, Robert P, MD No   Obesity affecting pregnancy in first trimester 08/22/2019 by Nadara MustardHarris, Robert P, MD No   Dizygotic twins 08/22/2019 by Nadara MustardHarris, Robert P, MD No   Overview Signed 09/08/2019  3:00 PM by Natale MilchSchuman, Amylee Lodato R, MD    Twin B demise      Supervision of high risk pregnancy, antepartum 08/22/2019 by Nadara MustardHarris, Robert P, MD No   Overview Signed 09/08/2019  3:02 PM by Natale MilchSchuman, Tatisha Cerino R, MD      Clinic [facility] Prenatal Labs  Dating  Blood type: A/Positive/-- (08/25 1431)   Genetic Screen 1 Screen:     AFP:      Quad:      NIPS:    Antibody:Negative (08/25 1431)  Anatomic US  Rubella: 3.23 (08/25 1431) Varicella: Immune  GTT Early:        28 wk:      RPR: Non Reactive (08/25 1431)   Rhogam  not needed HBsAg: Negative (08/25 1431)   TDaP vaccine                       HIV: Non Reactive (08/25 1431)   Flu Shot                                GBS:   Contraception  Pap: 2020 LSIL [ ]  colposcopy  CBB     CS/VBAC    Baby Food    Support Person  Gestational age appropriate obstetric precautions including but not limited to vaginal bleeding, contractions, leaking of fluid and fetal movement were reviewed in detail with the patient.    Return in about 1 week (around 09/13/2019) for [place] and [place].  Twin B demise today on [place]. Condolences offered. Discussed that she may pass the twin or that it may be absorbed as her viable pregnancy progressed. She has not had cramping or bleeding.   Because of this traumatic news will delay colposcopy until next visit or after delivery.   1 hour glucose labs today  Adrian Prows MD [place], [facility] 09/08/2019 2:59 PM

## 2019-09-06 NOTE — ED Notes (Signed)
Pt to the er for one episode of vomiting this morning and spitting blood. When asked if pt was still spitting blood, pt states no, she hasn't spit since this morning. Pt is in no acute distress. Pt is pregnant with twins. Appt with [facility] today after they open.

## 2019-09-06 NOTE — ED Provider Notes (Signed)
[facility] Emergency Department Provider Note ___________   First MD Initiated Contact with Patient 09/06/19 [phone]     (approximate)  I have reviewed the triage vital signs and the nursing notes.   HISTORY  Chief Complaint Emesis   HPI Mary Gilbert is a 28 y.o. female G3, P0 approximately [redacted] weeks pregnant with twin gestation presents to the emergency department secondary to vomiting this morning with "streaks" of blood noted in the emesis.  Patient states that she has had nausea during this pregnancy however only if "few episodes of vomiting".  Patient denies any abdominal pain no vaginal bleeding.  Patient denies any fever.  Patient is not currently prescribed any antiemetic.  Of note patient has an appointment with OB/GYN today at 2:30 PM        Past Medical History:  Diagnosis Date  . Hypertension   . Kidney stones   . UTI (urinary tract infection)     Patient Active Problem List   Diagnosis Date Noted  . Chronic hypertension affecting pregnancy 08/22/2019  . Obesity affecting pregnancy in first trimester 08/22/2019  . Dizygotic twins 08/22/2019  . Supervision of high risk pregnancy, antepartum 08/22/2019    Past Surgical History:  Procedure Laterality Date  . CHOLECYSTECTOMY    . LITHOTRIPSY      Prior to Admission medications   Medication Sig Start Date End Date Taking? Authorizing Provider  amLODipine (NORVASC) 5 MG tablet Take 5 mg by mouth daily.    [provider]  Multiple Vitamin (MULTIVITAMIN WITH MINERALS) TABS tablet Take 1 tablet by mouth daily.    [provider]    Allergies Patient has no known allergies.  Family History  Problem Relation Age of Onset  . Mental illness Mother   . Heart disease Father   . Hypertension Father     Social History Social History   Tobacco Use  . Smoking status: Current Every Day Smoker    Packs/day: 1.00    Types: Cigarettes  . Smokeless tobacco: Never Used   Substance Use Topics  . Alcohol use: No  . Drug use: No    Review of Systems Constitutional: No fever/chills Eyes: No visual changes. ENT: No sore throat. Cardiovascular: Denies chest pain. Respiratory: Denies shortness of breath. Gastrointestinal: No abdominal pain.  Positive for nausea and vomiting no diarrhea.  No constipation. Genitourinary: Negative for dysuria. Musculoskeletal: Negative for neck pain.  Negative for back pain. Integumentary: Negative for rash. Neurological: Negative for headaches, focal weakness or numbness.  ____________________________________________   PHYSICAL EXAM:  VITAL SIGNS: ED Triage Vitals  Enc Vitals Group     BP 09/06/19 0500 138/90     Pulse Rate 09/06/19 0500 80     Resp 09/06/19 0500 18     Temp 09/06/19 0500 97.8 F (36.6 C)     Temp Source 09/06/19 0500 Oral     SpO2 09/06/19 0500 97 %     Weight 09/06/19 0456 125.6 kg (276 lb 14.4 oz)     Height 09/06/19 0456 1.676 m (5\' 6" )     Head Circumference --      Peak Flow --      Pain Score 09/06/19 0456 0     Pain Loc --      Pain Edu? --      Excl. in [place]? --     Constitutional: Alert and oriented.  Eyes: Conjunctivae are normal.  Mouth/Throat: Mucous membranes are moist. Neck: No stridor.  No  meningeal signs.   Cardiovascular: Normal rate, regular rhythm. Good peripheral circulation. Grossly normal heart sounds. Respiratory: Normal respiratory effort.  No retractions. Gastrointestinal: Soft and nontender. No distention.  Musculoskeletal: No lower extremity tenderness nor edema. No gross deformities of extremities. Neurologic:  Normal speech and language. No gross focal neurologic deficits are appreciated.  Skin:  Skin is warm, dry and intact. Psychiatric: Mood and affect are normal. Speech and behavior are normal.   Procedures   ____________________________________________   INITIAL IMPRESSION / MDM / ASSESSMENT AND PLAN / ED COURSE  As part of my medical decision  making, I reviewed the following data within the electronic MEDICAL RECORD NUMBER 28 year old female presenting with above-stated history and physical exam secondary to nausea and episode of vomiting this morning.  History consistent with morning sickness and as such patient will be given vitamin B6 in the emergency department.  Considered a possibility of other potential intra-abdominal pathology that are not pregnancy related however given absence of abdominal pain and normal exam suspect this to be unlikely.  Patient symptoms most likely secondary to morning sickness.  Patient will be referred to OB/GYN for further evaluation today ____________________________________________  FINAL CLINICAL IMPRESSION(S) / ED DIAGNOSES  Final diagnoses:  Morning sickness     MEDICATIONS GIVEN DURING THIS VISIT:  Medications - No data to display   ED Discharge Orders    None      *Please note:  Mary Gilbert was evaluated in Emergency Department on 09/06/2019 for the symptoms described in the history of present illness. She was evaluated in the context of the global COVID-19 pandemic, which necessitated consideration that the patient might be at risk for infection with the SARS-CoV-2 virus that causes COVID-19. Institutional protocols and algorithms that pertain to the evaluation of patients at risk for COVID-19 are in a state of rapid change based on information released by regulatory bodies including the CDC and federal and state organizations. These policies and algorithms were followed during the patient's care in the ED.  Some ED evaluations and interventions may be delayed as a result of limited staffing during the pandemic.*  Note:  This document was prepared using Dragon voice recognition software and may include unintentional dictation errors.   Darci CurrentBrown, [place] N, MD 09/06/19 [phone]

## 2019-09-06 NOTE — ED Triage Notes (Addendum)
Patient ambulatory to triage with steady gait, without difficulty or distress noted, mask in place; pt reports approx 9-[redacted]wks pregnant with N/V tonight, blood in emesis noted; denies any abd pain; pt at [place], G3 P0

## 2019-09-07 LAB — GLUCOSE, 1 HOUR GESTATIONAL: Gestational Diabetes Screen: 112 mg/dL (ref 65–139)

## 2019-09-07 NOTE — Telephone Encounter (Signed)
Can have 1 week off of work

## 2019-09-07 NOTE — Telephone Encounter (Signed)
Patient is calling needing to speak with someone about her note for work. Patient was requesting just today off. Could you please contact the patient . Thank you

## 2019-09-08 ENCOUNTER — Encounter: Payer: Self-pay | Admitting: Obstetrics and Gynecology

## 2019-09-08 DIAGNOSIS — R1110 Vomiting, unspecified: Secondary | ICD-10-CM | POA: Insufficient documentation

## 2019-09-08 DIAGNOSIS — O3110X0 Continuing pregnancy after spontaneous abortion of one fetus or more, unspecified trimester, not applicable or unspecified: Secondary | ICD-10-CM | POA: Insufficient documentation

## 2019-09-09 ENCOUNTER — Emergency Department: Payer: Medicaid Other

## 2019-09-09 ENCOUNTER — Emergency Department
Admission: EM | Admit: 2019-09-09 | Discharge: 2019-09-09 | Disposition: A | Discharge status: Home / Self Care | Payer: Medicaid Other | Attending: Emergency Medicine | Admitting: Emergency Medicine

## 2019-09-09 ENCOUNTER — Encounter: Payer: Self-pay | Admitting: Emergency Medicine

## 2019-09-09 ENCOUNTER — Other Ambulatory Visit: Payer: Self-pay

## 2019-09-09 DIAGNOSIS — Z3A01 Less than 8 weeks gestation of pregnancy: Secondary | ICD-10-CM | POA: Diagnosis not present

## 2019-09-09 DIAGNOSIS — F17210 Nicotine dependence, cigarettes, uncomplicated: Secondary | ICD-10-CM | POA: Insufficient documentation

## 2019-09-09 DIAGNOSIS — O99331 Smoking (tobacco) complicating pregnancy, first trimester: Secondary | ICD-10-CM | POA: Diagnosis not present

## 2019-09-09 DIAGNOSIS — Z79899 Other long term (current) drug therapy: Secondary | ICD-10-CM | POA: Insufficient documentation

## 2019-09-09 DIAGNOSIS — O209 Hemorrhage in early pregnancy, unspecified: Secondary | ICD-10-CM | POA: Diagnosis present

## 2019-09-09 DIAGNOSIS — O10011 Pre-existing essential hypertension complicating pregnancy, first trimester: Secondary | ICD-10-CM | POA: Diagnosis not present

## 2019-09-09 LAB — URINALYSIS, COMPLETE (UACMP) WITH MICROSCOPIC
Bacteria, UA: NONE SEEN
Bilirubin Urine: NEGATIVE
Glucose, UA: NEGATIVE mg/dL
Ketones, ur: NEGATIVE mg/dL
Nitrite: NEGATIVE
Protein, ur: 100 mg/dL — AB
RBC / HPF: >50 RBC/hpf — ABNORMAL HIGH (ref 0–5)
Specific Gravity, Urine: 1.016 (ref 1.005–1.030)
WBC, UA: >50 WBC/hpf — ABNORMAL HIGH (ref 0–5)
pH: 6 (ref 5.0–8.0)

## 2019-09-09 LAB — CBC
HCT: 42.5 % (ref 36.0–46.0)
Hemoglobin: 14.3 g/dL (ref 12.0–15.0)
MCH: 29.9 pg (ref 26.0–34.0)
MCHC: 33.6 g/dL (ref 30.0–36.0)
MCV: 88.9 fL (ref 80.0–100.0)
Platelets: 236 10*3/uL (ref 150–400)
RBC: 4.78 MIL/uL (ref 3.87–5.11)
RDW: 12.6 % (ref 11.5–15.5)
WBC: 14.2 10*3/uL — ABNORMAL HIGH (ref 4.0–10.5)
nRBC: 0 % (ref 0.0–0.2)

## 2019-09-09 LAB — POCT PREGNANCY, URINE: Preg Test, Ur: POSITIVE — AB

## 2019-09-09 LAB — ABO/RH: ABO/RH(D): A POS

## 2019-09-09 NOTE — ED Provider Notes (Signed)
[facility] Emergency Department Provider Note   ____________________________________________   I have reviewed the triage vital signs and the nursing notes.   HISTORY  Chief Complaint Abdominal Pain and Vaginal Bleeding   History limited by: Not Limited   HPI Mary GoldHeather Gilbert is a 28 y.o. female who presents to the emergency department today because of concern for vaginal bleeding. The patient does have concern because she is roughly [redacted] weeks pregnant with twins however just learned the other day of one having fetal demise. The patient has had some associated lower abdominal pain and discomfort. The patient states that it has been bright red blood. She has had some clots. Denies any fevers or shortness of breath.  Records reviewed. Per medical record review patient has a history of HTN, kidney stones, UTI.   Past Medical History:  Diagnosis Date  . Hypertension   . Kidney stones   . UTI (urinary tract infection)     Patient Active Problem List   Diagnosis Date Noted  . Hyperemesis 09/08/2019  . 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 Chronic hypertension affecting pregnancy 08/22/2019  . Obesity affecting pregnancy in first trimester 08/22/2019  . Dizygotic twins 08/22/2019  . Supervision of high risk pregnancy, antepartum 08/22/2019    Past Surgical History:  Procedure Laterality Date  . CHOLECYSTECTOMY    . LITHOTRIPSY      Prior to Admission medications   Medication Sig Start Date End Date Taking? Authorizing Provider  amLODipine (NORVASC) 5 MG tablet Take 5 mg by mouth daily.    [provider]  Multiple Vitamin (MULTIVITAMIN WITH MINERALS) TABS tablet Take 1 tablet by mouth daily.    [provider]    Allergies Patient has no known allergies.  Family History  Problem Relation Age of Onset  . Mental illness Mother   . Heart disease Father   . Hypertension Father     Social History Social History   Tobacco Use   . Smoking status: Current Every Day Smoker    Packs/day: 1.00    Types: Cigarettes  . Smokeless tobacco: Never Used  Substance Use Topics  . Alcohol use: No  . Drug use: No    Review of Systems Constitutional: No fever/chills Eyes: No visual changes. ENT: No sore throat. Cardiovascular: Denies chest pain. Respiratory: Denies shortness of breath. Gastrointestinal: Positive for lower abdominal pain.  Genitourinary: Positive for vaginal bleeding.  Musculoskeletal: Negative for back pain. Skin: Negative for rash. Neurological: Negative for headaches, focal weakness or numbness.  ____________________________________________   PHYSICAL EXAM:  VITAL SIGNS: ED Triage Vitals  Enc Vitals Group     BP 09/09/19 0247 (!) 156/92     Pulse Rate 09/09/19 0247 97     Resp 09/09/19 0247 20     Temp 09/09/19 0247 (!) 97.5 F (36.4 C)     Temp Source 09/09/19 0247 Oral     SpO2 09/09/19 0247 98 %     Weight 09/09/19 0248 273 lb 5.9 oz (124 kg)     Height 09/09/19 0248 5\' 6"  (1.676 m)     Head Circumference --      Peak Flow --      Pain Score 09/09/19 0247 5   Constitutional: Alert and oriented.  Eyes: Conjunctivae are normal.  ENT      Head: Normocephalic and atraumatic.      Nose: No congestion/rhinnorhea.      Mouth/Throat: Mucous membranes are moist.      Neck: No  stridor. Hematological/Lymphatic/Immunilogical: No cervical lymphadenopathy. Cardiovascular: Normal rate, regular rhythm.  No murmurs, rubs, or gallops.  Respiratory: Normal respiratory effort without tachypnea nor retractions. Breath sounds are clear and equal bilaterally. No wheezes/rales/rhonchi. Gastrointestinal: Soft and non tender. No rebound. No guarding.  Genitourinary: Deferred Musculoskeletal: Normal range of motion in all extremities.  Neurologic:  Normal speech and language. No gross focal neurologic deficits are appreciated.  Skin:  Skin is warm, dry and intact. No rash noted. Psychiatric: Mood and  affect are normal. Speech and behavior are normal. Patient exhibits appropriate insight and judgment.  ____________________________________________    LABS (pertinent positives/negatives)  A pos CBC wbc 14.2, hgb 14.3, plt 236 UA cloudy, large hgb dipstick, protein 100, leukocytes trace, > 50 rbc and wbc  ____________________________________________   EKG  None  ____________________________________________    RADIOLOGY  [place] Twin gestation. Fetal demise for twin B. Good cardiac rhythm for twin A.  ____________________________________________   PROCEDURES  Procedures  ____________________________________________   INITIAL IMPRESSION / ASSESSMENT AND PLAN / ED COURSE  Pertinent labs & imaging results that were available during my care of the patient were reviewed by me and considered in my medical decision making (see chart for details).   Patient presented to the emergency department today because of concern for vaginal bleeding in early pregnancy. The patient has known fetal demise in one of the twins. Other twin appears well on [place] today. [place] also showed subchorionic hematoma. Discussed this with patient. Discussed importance of Ob/gyn follow up.   ____________________________________________   FINAL CLINICAL IMPRESSION(S) / ED DIAGNOSES  Final diagnoses:  Vaginal bleeding affecting early pregnancy     Note: This dictation was prepared with Dragon dictation. Any transcriptional errors that result from this process are unintentional     Nance Pear, MD 09/09/19 [phone]

## 2019-09-09 NOTE — ED Notes (Signed)
This RN called pt to get triage done, informed family mbr to wait in the car or outside until pt has a room and can come and stay with pt in the room, Rn pushed pt to triage area and attempted to start VS, pt started to yell to RN profanities saying  that she just lost a twin 2 days ago and she started to bleed today wanted to see if her bay is ok, RN explained to pt that we are here to help but we need to get VS, and blood work to get started, pt yelled that she got blood work done 2 days ago and wanted to know if she is loosing her bay this RN and Arts development officer, RN tried to explain pt that we need to get her blood work to [employer] on her on blood count pt cussed at [employer] and walked away "I am going to another hospital" RN enc. Pt to stay to help her pt yelled "Why don't you ck if the baby is ok" RN again informed pt that we need to start with blood work and VS so we can ck her pregnancy.

## 2019-09-09 NOTE — ED Triage Notes (Signed)
Pt presents to ER reports vaginal bleeding tonight at 2am reports 2 days ago she miscarried a twin, reports she is [redacted] weeks pregnant. Pt tearful in triage, upset. Pt talks in complete sentences no respiratory distress noted

## 2019-09-09 NOTE — Discharge Instructions (Signed)
Please seek medical attention for any high fevers, chest pain, shortness of breath, change in behavior, persistent vomiting, bloody stool or any other new or concerning symptoms.  

## 2019-09-13 ENCOUNTER — Ambulatory Visit (INDEPENDENT_AMBULATORY_CARE_PROVIDER_SITE_OTHER): Payer: Medicaid Other | Admitting: Obstetrics and Gynecology

## 2019-09-13 ENCOUNTER — Other Ambulatory Visit: Payer: Self-pay

## 2019-09-13 VITALS — BP 161/95 | Wt 280.0 lb

## 2019-09-13 DIAGNOSIS — O10911 Unspecified pre-existing hypertension complicating pregnancy, first trimester: Secondary | ICD-10-CM

## 2019-09-13 DIAGNOSIS — O10919 Unspecified pre-existing hypertension complicating pregnancy, unspecified trimester: Secondary | ICD-10-CM

## 2019-09-13 DIAGNOSIS — O99211 Obesity complicating pregnancy, first trimester: Secondary | ICD-10-CM

## 2019-09-13 DIAGNOSIS — O0990 Supervision of high risk pregnancy, unspecified, unspecified trimester: Secondary | ICD-10-CM

## 2019-09-13 DIAGNOSIS — Z1379 Encounter for other screening for genetic and chromosomal anomalies: Secondary | ICD-10-CM

## 2019-09-13 DIAGNOSIS — O3110X0 Continuing pregnancy after spontaneous abortion of one fetus or more, unspecified trimester, not applicable or unspecified: Secondary | ICD-10-CM

## 2019-09-13 DIAGNOSIS — IMO0002 Reserved for concepts with insufficient information to code with codable children: Secondary | ICD-10-CM

## 2019-09-13 DIAGNOSIS — Z3A10 10 weeks gestation of pregnancy: Secondary | ICD-10-CM

## 2019-09-13 LAB — POCT URINALYSIS DIPSTICK OB
Glucose, UA: NEGATIVE
POC,PROTEIN,UA: NEGATIVE

## 2019-09-13 MED ORDER — LABETALOL HCL 100 MG PO TABS: 100.0000 mg | ORAL_TABLET | Freq: Two times a day (BID) | ORAL | 6 refills | Status: DC

## 2019-09-13 NOTE — Progress Notes (Signed)
ROB ER follow up bleeding

## 2019-09-13 NOTE — Progress Notes (Signed)
Routine Prenatal Care Visit  Subjective  Mary Gilbert is a 28 y.o. G3P0020 at 6722w4d being seen today for ongoing prenatal care.  She is currently monitored for the following issues for this high-risk pregnancy and has Chronic hypertension affecting pregnancy; Obesity affecting pregnancy in first trimester; Dizygotic twins; Supervision of high risk pregnancy, antepartum; Hyperemesis; and Vanishing twin syndrome on their problem list.  ----------------------------------------------------------------------------------- Patient reports some headaches.   Contractions: Not present. Vag. Bleeding: Scant.  Movement: Present. Denies leaking of fluid.  ----------------------------------------------------------------------------------- The following portions of the patient's history were reviewed and updated as appropriate: allergies, current medications, past family history, past medical history, past social history, past surgical history and problem list. Problem list updated.   Objective  Blood pressure (!) 161/95, weight 280 lb (127 kg), last menstrual period 06/26/2019, unknown if currently breastfeeding. Pregravid weight 272 lb (123.4 kg) Total Weight Gain 8 lb (3.629 kg) Urinalysis:      Fetal Status: Fetal Heart Rate (bpm): 175   Movement: Present     General:  Alert, oriented and cooperative. Patient is in no acute distress.  Skin: Skin is warm and dry. No rash noted.   Cardiovascular: Normal heart rate noted  Respiratory: Normal respiratory effort, no problems with respiration noted  Abdomen: Soft, gravid, appropriate for gestational age. Pain/Pressure: Absent     Pelvic:  Cervical exam deferred        Extremities: Normal range of motion.     ental Status: Normal mood and affect. Normal behavior. Normal judgment and thought content.     Assessment   28 y.o. 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 by Natale MilchSchuman, Christanna R, MD No   Chronic hypertension affecting pregnancy 08/22/2019 by Nadara MustardHarris, Robert P, MD No   Obesity affecting pregnancy in first trimester 08/22/2019 by Nadara MustardHarris, Robert P, MD No   Dizygotic twins 08/22/2019 by Nadara MustardHarris, Robert P, MD No   Overview Signed 09/08/2019  3:00 PM by Natale MilchSchuman, Christanna R, MD    Twin B demise      Supervision of high risk pregnancy, antepartum 08/22/2019 by Nadara MustardHarris, Robert P, MD No   Overview Signed 09/08/2019  3:02 PM by Natale MilchSchuman, Christanna R, MD      Clinic [facility] Prenatal Labs  Dating 6 week US Blood type: A/Positive/-- (08/25 1431)   Genetic Screen 1 Screen:      Antibody:Negative (08/25 1431)  Anatomic US  Rubella: 3.23 (08/25 1431) Varicella: Immune  GTT Early:  112      28 wk:      RPR: Non Reactive (08/25 1431)   Rhogam  not needed HBsAg: Negative (08/25 1431)   TDaP vaccine                       HIV: Non Reactive (08/25 1431)   Flu Shot                                GBS:   Contraception  Pap: 2020 LSIL [ ]  colposcopy  CBB     CS/VBAC    Baby Food    Support Person  Gestational age appropriate obstetric precautions including but not limited to vaginal bleeding, contractions, leaking of fluid and fetal movement were reviewed in detail with the patient.    - Start labetalol 100mg  po bid - Given vanishing twin NT scan as opposed to MaterniT21 at 12 weeks  Return in about 1 week (around 09/20/2019) for ROB Schawn Byas, 2 week ROB and NT [place].  Malachy Mood, MD, [facility], [facility] 09/13/2019, 5:00 PM

## 2019-09-21 ENCOUNTER — Other Ambulatory Visit: Payer: Self-pay

## 2019-09-21 ENCOUNTER — Encounter: Payer: Self-pay | Admitting: Obstetrics and Gynecology

## 2019-09-21 ENCOUNTER — Ambulatory Visit (INDEPENDENT_AMBULATORY_CARE_PROVIDER_SITE_OTHER): Payer: Medicaid Other | Admitting: Obstetrics and Gynecology

## 2019-09-21 VITALS — BP 136/88 | Wt 276.0 lb

## 2019-09-21 DIAGNOSIS — Z7189 Other specified counseling: Secondary | ICD-10-CM | POA: Diagnosis not present

## 2019-09-21 DIAGNOSIS — O021 Missed abortion: Secondary | ICD-10-CM | POA: Diagnosis not present

## 2019-09-21 MED ORDER — ESCITALOPRAM OXALATE 10 MG PO TABS: 10.0000 mg | ORAL_TABLET | Freq: Every day | ORAL | 3 refills | Status: DC

## 2019-09-21 NOTE — Patient Instructions (Signed)
[email or web address]

## 2019-09-29 ENCOUNTER — Encounter: Payer: Medicaid Other | Admitting: Obstetrics and Gynecology

## 2019-09-29 ENCOUNTER — Other Ambulatory Visit: Payer: Medicaid Other

## 2019-10-05 ENCOUNTER — Ambulatory Visit (INDEPENDENT_AMBULATORY_CARE_PROVIDER_SITE_OTHER): Payer: Medicaid Other | Admitting: Obstetrics and Gynecology

## 2019-10-05 ENCOUNTER — Other Ambulatory Visit: Payer: Self-pay

## 2019-10-05 ENCOUNTER — Encounter: Payer: Self-pay | Admitting: Obstetrics and Gynecology

## 2019-10-05 VITALS — BP 148/86 | Wt 274.0 lb

## 2019-10-05 DIAGNOSIS — Z7189 Other specified counseling: Secondary | ICD-10-CM | POA: Diagnosis not present

## 2019-10-05 DIAGNOSIS — N96 Recurrent pregnancy loss: Secondary | ICD-10-CM

## 2019-10-05 DIAGNOSIS — O039 Complete or unspecified spontaneous abortion without complication: Secondary | ICD-10-CM | POA: Diagnosis not present

## 2019-10-05 MED ORDER — TRAZODONE HCL 50 MG PO TABS: 50.0000 mg | ORAL_TABLET | Freq: Every evening | ORAL | 2 refills | Status: DC | PRN

## 2019-10-05 MED ORDER — ESCITALOPRAM OXALATE 10 MG PO TABS: 10.0000 mg | ORAL_TABLET | Freq: Every day | ORAL | 3 refills | Status: DC

## 2019-10-05 NOTE — Progress Notes (Signed)
Obstetrics & Gynecology Office Visit   Chief Complaint: No chief complaint on file.   History of Present Illness: 28 y.o. [id number] presenting for medication follow up for a diagnosis of bereavement.  She is currently being managed with lexapro 10mg  po daily.   The patient reports good control of symptoms on her current regimen.  On her current medication regiment.  She continues to have some vaginal bleeding and we discussed that time frame to cessation of bleeding is variable following SAB.  She has not noted any side-effects or new symptoms.    Review of Systems: review of systems negative unless otherwise noted in HPI  Past Medical History:  Past Medical History:  Diagnosis Date  . Hypertension   . Kidney stones   . UTI (urinary tract infection)     Past Surgical History:  Past Surgical History:  Procedure Laterality Date  . CHOLECYSTECTOMY    . LITHOTRIPSY      Gynecologic History: No LMP recorded.  Obstetric History: G3P0020  Family History:  Family History  Problem Relation Age of Onset  . Mental illness Mother   . Heart disease Father   . Hypertension Father     Social History:  Social History   Socioeconomic History  . Marital status: Single    Spouse name: Not on file  . Number of children: Not on file  . Years of education: Not on file  . Highest education level: Not on file  Occupational History  . Not on file  Social Needs  . Financial resource strain: Not on file  . Food insecurity    Worry: Not on file    Inability: Not on file  . Transportation needs    Medical: Not on file    Non-medical: Not on file  Tobacco Use  . Smoking status: Current Every Day Smoker    Packs/day: 1.00    Types: Cigarettes  . Smokeless tobacco: Never Used  Substance and Sexual Activity  . Alcohol use: No  . Drug use: No  . Sexual activity: Yes  Lifestyle  . Physical activity    Days per week: Not on file    Minutes per session: Not on file  . Stress: Not  on file  Relationships  . Social Herbalist on phone: Not on file    Gets together: Not on file    Attends religious service: Not on file    Active member of club or organization: Not on file    Attends meetings of clubs or organizations: Not on file    Relationship status: Not on file  . Intimate partner violence    Fear of current or ex partner: Not on file    Emotionally abused: Not on file    Physically abused: Not on file    Forced sexual activity: Not on file  Other Topics Concern  . Not on file  Social History Narrative  . Not on file    Allergies:  No Known Allergies  Medications: Prior to Admission medications   Medication Sig Start Date End Date Taking? Authorizing Provider  amLODipine (NORVASC) 5 MG tablet Take 5 mg by mouth daily.    [provider]  escitalopram (LEXAPRO) 10 MG tablet Take 1 tablet (10 mg total) by mouth daily. 09/21/19   Malachy Mood, MD  labetalol (NORMODYNE) 100 MG tablet Take 1 tablet (100 mg total) by mouth 2 (two) times daily. 09/13/19   Malachy Mood, MD  Multiple Vitamin (MULTIVITAMIN WITH MINERALS) TABS tablet Take 1 tablet by mouth daily.    [provider]    Physical Exam Vitals: Blood pressure (!) 148/86, weight 274 lb (124.3 kg), unknown if currently breastfeeding.  No LMP recorded.  General: NAD HEENT: normocephalic, anicteric Thyroid: no enlargement, no palpable nodules Pulmonary: No increased work of breathing Cardiovascular: RRR, distal pulses 2+ Abdomen: NABS, soft, non-tender, non-distended.  Umbilicus without lesions.  No hepatomegaly, splenomegaly or masses palpable. No evidence of hernia  Genitourinary:  External: Normal external female genitalia.  Normal urethral meatus, normal  Bartholin's and Skene's glands.    Vagina: Normal vaginal mucosa, no evidence of prolapse.    Cervix: Grossly normal in appearance, no bleeding  Uterus: Non-enlarged, mobile, normal contour.  No CMT  Adnexa:  ovaries non-enlarged, no adnexal masses  Rectal: deferred  Lymphatic: no evidence of inguinal lymphadenopathy Extremities: no edema, erythema, or tenderness Neurologic: Grossly intact Psychiatric: mood appropriate, affect full  Female chaperone present for pelvic  portions of the physical exam  Assessment: 28 y.o. G3P0030 follow   Plan: Problem List Items Addressed This Visit    None     1) BP - continue labetalol, may increase dose if still elevated at 6 week visit  2) Bereavement - doing better no issues on Lexapro 10mg  po daily with overall good subjective improvement.  3) Recurrent miscarriage - hypercoaguable work up at 6 week follow up discussed  4) A total of 15 minutes were spent in face-to-face contact with the patient during this encounter with over half of that time devoted to counseling and coordination of care.  5) Return in about 4 weeks (around Mary/04/2019) for medication.   13/04/2019, MD, [facility], [facility] 10/05/2019, 1:25 PM

## 2019-10-09 NOTE — Progress Notes (Signed)
Obstetrics & Gynecology Office Visit   Chief Complaint:  Chief Complaint  Patient presents with  . Follow-up    SAB twin pregnancy    History of Present Illness: 28 year old G3P0020 presenting for follow up of ER visit at [facility] for completed abortion 09/17/2019.  Patient has been followed for a di/di twin pregnancy with early loss of twin B.  Fetal heart tones noted 09/13/2019 on twin A at [redacted]w[redacted]d.  Patient is Rh positive rhogam not indicated.  Still having some vaginal bleeding.     Review of Systems: Review of Systems  Constitutional: Negative.   Genitourinary: Negative.   Skin: Negative.   Psychiatric/Behavioral: Positive for depression. Negative for hallucinations, substance abuse and suicidal ideas. The patient has insomnia. The patient is not nervous/anxious.      Past Medical History:  Past Medical History:  Diagnosis Date  . Hypertension   . Kidney stones   . UTI (urinary tract infection)     Past Surgical History:  Past Surgical History:  Procedure Laterality Date  . CHOLECYSTECTOMY    . LITHOTRIPSY      Gynecologic History: Patient's last menstrual period was 06/26/2019.  Obstetric History: G3P0020  Family History:  Family History  Problem Relation Age of Onset  . Mental illness Mother   . Heart disease Father   . Hypertension Father     Social History:  Social History   Socioeconomic History  . Marital status: Single    Spouse name: Not on file  . Number of children: Not on file  . Years of education: Not on file  . Highest education level: Not on file  Occupational History  . Not on file  Social Needs  . Financial resource strain: Not on file  . Food insecurity    Worry: Not on file    Inability: Not on file  . Transportation needs    Medical: Not on file    Non-medical: Not on file  Tobacco Use  . Smoking status: Current Every Day Smoker    Packs/day: 1.00    Types: Cigarettes  . Smokeless tobacco: Never Used  Substance and Sexual  Activity  . Alcohol use: No  . Drug use: No  . Sexual activity: Yes  Lifestyle  . Physical activity    Days per week: Not on file    Minutes per session: Not on file  . Stress: Not on file  Relationships  . Social Musician on phone: Not on file    Gets together: Not on file    Attends religious service: Not on file    Active member of club or organization: Not on file    Attends meetings of clubs or organizations: Not on file    Relationship status: Not on file  . Intimate partner violence    Fear of current or ex partner: Not on file    Emotionally abused: Not on file    Physically abused: Not on file    Forced sexual activity: Not on file  Other Topics Concern  . Not on file  Social History Narrative  . Not on file    Allergies:  No Known Allergies  Medications: Prior to Admission medications   Medication Sig Start Date End Date Taking? Authorizing Provider  amLODipine (NORVASC) 5 MG tablet Take 5 mg by mouth daily.    [provider]  escitalopram (LEXAPRO) 10 MG tablet Take 1 tablet (10 mg total) by mouth daily.  10/05/19   Malachy Mood, MD  labetalol (NORMODYNE) 100 MG tablet Take 1 tablet (100 mg total) by mouth 2 (two) times daily. 09/13/19   Malachy Mood, MD  Multiple Vitamin (MULTIVITAMIN WITH MINERALS) TABS tablet Take 1 tablet by mouth daily.    [provider]  traZODone (DESYREL) 50 MG tablet Take 1 tablet (50 mg total) by mouth at bedtime as needed for sleep. 10/05/19   Malachy Mood, MD    Physical Exam Vitals:  Vitals:   09/21/19 1330  BP: 136/88   Patient's last menstrual period was 06/26/2019.  General: NAD HEENT: normocephalic, anicteric Pulmonary: No increased work of breathing  masses palpable. No evidence of hernia  Genitourinary:  External: Normal external female genitalia.  Normal urethral meatus, normal  Bartholin's and Skene's glands.    Vagina: Normal vaginal mucosa, no evidence of prolapse.     Cervix: Grossly normal in appearance, no bleeding  Uterus: Non-enlarged, mobile, normal contour.  No CMT  Adnexa: ovaries non-enlarged, no adnexal masses  Rectal: deferred  Lymphatic: no evidence of inguinal lymphadenopathy Extremities: no edema, erythema, or tenderness Neurologic: Grossly intact Psychiatric: mood appropriate, affect full  Bedside US homogenous endometrial stripe 6mm.   No evidence of retained POC  Female chaperone present for pelvic  portions of the physical exam  Assessment: 28 y.o. G3P0020 follow up missed abortion  Plan: Problem List Items Addressed This Visit    None    Visit Diagnoses    Bereavement counseling    -  Primary     1) Bereavement - patient with history of recurrent miscarriage, Discussed the natural grieving process. Will start Lexapro 10mg  po daily  2) HTN - good control on current dose of labetalol  3) A total of 15 minutes were spent in face-to-face contact with the patient during this encounter with over half of that time devoted to counseling and coordination of care.  4) Return in about 2 weeks (around 10/05/2019) for medication follow up.   Malachy Mood, MD, [facility], [place]

## 2019-10-27 ENCOUNTER — Other Ambulatory Visit: Payer: Self-pay | Admitting: Obstetrics and Gynecology

## 2019-10-27 ENCOUNTER — Other Ambulatory Visit: Payer: Medicaid Other

## 2019-10-27 ENCOUNTER — Other Ambulatory Visit: Payer: Self-pay

## 2019-10-27 DIAGNOSIS — N96 Recurrent pregnancy loss: Secondary | ICD-10-CM

## 2019-10-27 DIAGNOSIS — Z8759 Personal history of other complications of pregnancy, childbirth and the puerperium: Secondary | ICD-10-CM

## 2019-10-27 DIAGNOSIS — O3680X0 Pregnancy with inconclusive fetal viability, not applicable or unspecified: Secondary | ICD-10-CM

## 2019-10-27 NOTE — Telephone Encounter (Signed)
Progesterone and HCG lab today 10/30 and repeat HCG Tuesday 11/3

## 2019-10-28 LAB — PROGESTERONE: Progesterone: 5.3 ng/mL

## 2019-10-28 LAB — BETA HCG QUANT (REF LAB): hCG Quant: <1 m[IU]/mL

## 2019-11-02 ENCOUNTER — Ambulatory Visit: Payer: Medicaid Other | Admitting: Obstetrics and Gynecology

## 2019-11-27 ENCOUNTER — Other Ambulatory Visit: Payer: Self-pay | Admitting: Obstetrics and Gynecology

## 2019-11-27 MED ORDER — MEDROXYPROGESTERONE ACETATE 10 MG PO TABS: 10.0000 mg | ORAL_TABLET | Freq: Every day | ORAL | 0 refills | Status: DC

## 2019-12-25 ENCOUNTER — Other Ambulatory Visit: Payer: Self-pay | Admitting: Obstetrics and Gynecology

## 2019-12-25 ENCOUNTER — Telehealth: Payer: Self-pay | Admitting: Obstetrics and Gynecology

## 2019-12-25 DIAGNOSIS — N939 Abnormal uterine and vaginal bleeding, unspecified: Secondary | ICD-10-CM

## 2019-12-25 NOTE — Telephone Encounter (Signed)
Ultrasound and follow up in 1 week for abnormal uterine bleeding

## 2019-12-25 NOTE — Telephone Encounter (Signed)
Called and left voicemail for patient to call back to be schedule for ultrasound and follow up with [facility]

## 2019-12-25 NOTE — Telephone Encounter (Signed)
Patient aware of location dates and times for schedule appointments

## 2020-01-02 ENCOUNTER — Other Ambulatory Visit: Payer: Self-pay

## 2020-01-02 ENCOUNTER — Ambulatory Visit (INDEPENDENT_AMBULATORY_CARE_PROVIDER_SITE_OTHER): Payer: Medicaid Other

## 2020-01-02 DIAGNOSIS — N939 Abnormal uterine and vaginal bleeding, unspecified: Secondary | ICD-10-CM | POA: Diagnosis not present

## 2020-01-03 ENCOUNTER — Ambulatory Visit (INDEPENDENT_AMBULATORY_CARE_PROVIDER_SITE_OTHER): Payer: Medicaid Other | Admitting: Obstetrics and Gynecology

## 2020-01-03 ENCOUNTER — Other Ambulatory Visit: Payer: Self-pay

## 2020-01-03 ENCOUNTER — Encounter: Payer: Self-pay | Admitting: Obstetrics and Gynecology

## 2020-01-03 VITALS — BP 164/107 | Ht 66.0 in | Wt 279.0 lb

## 2020-01-03 DIAGNOSIS — N939 Abnormal uterine and vaginal bleeding, unspecified: Secondary | ICD-10-CM | POA: Diagnosis not present

## 2020-01-03 DIAGNOSIS — N761 Subacute and chronic vaginitis: Secondary | ICD-10-CM

## 2020-01-03 DIAGNOSIS — Z113 Encounter for screening for infections with a predominantly sexual mode of transmission: Secondary | ICD-10-CM | POA: Diagnosis not present

## 2020-01-03 MED ORDER — METRONIDAZOLE 500 MG PO TABS: 500.0000 mg | ORAL_TABLET | Freq: Two times a day (BID) | ORAL | 0 refills | Status: DC

## 2020-01-03 NOTE — Patient Instructions (Signed)
Mary [email or web address]  Polycystic Ovarian Syndrome  Polycystic ovarian syndrome (PCOS) is a common hormonal disorder among women of reproductive age. In most women with PCOS, many small fluid-filled sacs (cysts) grow on the ovaries, and the cysts are not part of a normal menstrual cycle. PCOS can cause problems with your menstrual periods and make it difficult to get pregnant. It can also cause an increased risk of miscarriage with pregnancy. If it is not treated, PCOS can lead to serious health problems, such as diabetes and heart disease. What are the causes? The cause of PCOS is not known, but it may be the result of a combination of certain factors, such as:  Irregular menstrual cycle.  High levels of certain hormones (androgens).  Problems with the hormone that helps to control blood sugar (insulin resistance).  Certain genes. What increases the risk? This condition is more likely to develop in women who have a family history of PCOS. What are the signs or symptoms? Symptoms of PCOS may include:  Multiple ovarian cysts.  Infrequent periods or no periods.  Periods that are too frequent or too heavy.  Unpredictable periods.  Inability to get pregnant (infertility) because of not ovulating.  Increased growth of hair on the face, chest, stomach, back, thumbs, thighs, or toes.  Acne or oily skin. Acne may develop during adulthood, and it may not respond to treatment.  Pelvic pain.  Weight gain or obesity.  Patches of thickened and dark brown or black skin on the neck, arms, breasts, or thighs (acanthosis nigricans).  Excess hair growth on the face, chest, abdomen, or upper thighs (hirsutism). How is this diagnosed? This condition is diagnosed based on:  Your medical history.  A physical exam, including a pelvic exam. Your health care provider may look for areas of increased hair growth on your skin.  Tests, such as: ? Ultrasound. This may be used to examine  the ovaries and the lining of the uterus (endometrium) for cysts. ? Blood tests. These may be used to check levels of sugar (glucose), female hormone (testosterone), and female hormones (estrogen and progesterone) in your blood. How is this treated? There is no cure for PCOS, but treatment can help to manage symptoms and prevent more health problems from developing. Treatment varies depending on:  Your symptoms.  Whether you want to have a baby or whether you need birth control (contraception). Treatment may include nutrition and lifestyle changes along with:  Progesterone hormone to start a menstrual period.  Birth control pills to help you have regular menstrual periods.  Medicines to make you ovulate, if you want to get pregnant.  Medicine to reduce excessive hair growth.  Surgery, in severe cases. This may involve making small holes in one or both of your ovaries. This decreases the amount of testosterone that your body produces. Follow these instructions at home:  Take over-the-counter and prescription medicines only as told by your health care provider.  Follow a healthy meal plan. This can help you reduce the effects of PCOS. ? Eat a healthy diet that includes lean proteins, complex carbohydrates, fresh fruits and vegetables, low-fat dairy products, and healthy fats. Make sure to eat enough fiber.  If you are overweight, lose weight as told by your health care provider. ? Losing 10% of your body weight may improve symptoms. ? Your health care provider can determine how much weight loss is best for you and can help you lose weight safely.  Keep all follow-up visits as told by your health  care provider. This is important. Contact a health care provider if:  Your symptoms do not get better with medicine.  You develop new symptoms. This information is not intended to replace advice given to you by your health care provider. Make sure you discuss any questions you have with your  health care provider. Document Revised: 11/26/2017 Document Reviewed: 05/31/2016 Elsevier Patient Education  2020 [employer].

## 2020-01-03 NOTE — Progress Notes (Signed)
Gynecology Ultrasound Follow Up  Chief Complaint:  Chief Complaint  Mary Gilbert presents with  . Follow-up    u/s  . STD testing  . Vaginal Odor    not sure of fishy/sour x few days     History of Present Illness: Mary Gilbert is a 29 y.o. female who presents today for ultrasound evaluation of AUB.  Ultrasound demonstrates the following findgins Adnexa: no masses, some views do show multiple follicles Uterus: Non-enlarged with endometrial stripe  71mm no focal abnormaliteis Additional: no free fluid  [name] is a 29 y.o. G3P0020 who LMP was No LMP recorded., presents today for a problem visit.   Mary Gilbert complains of an abnormal vaginal discharge. Discharge described as: thin and malodorous. Vaginal symptoms include none.   Other associated symptoms: none.Menstrual pattern: She had been bleeding irregularly. Contraception: none.  She denies recent antibiotic exposure, denies changes in soaps, detergents coinciding with the onset of her symptoms.  She has not previously self treated or been under treatment by another provider for these symptoms.   Review of Systems: Review of Systems  Constitutional: Negative.   Genitourinary: Negative.   Skin: Negative.     Past Medical History:  Past Medical History:  Diagnosis Date  . Hypertension   . Kidney stones   . UTI (urinary tract infection)     Past Surgical History:  Past Surgical History:  Procedure Laterality Date  . CHOLECYSTECTOMY    . LITHOTRIPSY      Gynecologic History:  No LMP recorded. Contraception: none Last Pap: LGSIL pap 08/22/2019  Family History:  Family History  Problem Relation Age of Onset  . Mental illness Mother   . Heart disease Father   . Hypertension Father     Social History:  Social History   Socioeconomic History  . Marital status: Single    Spouse name: Not on file  . Number of children: Not on file  . Years of education: Not on file  . Highest education level: Not on file    Occupational History  . Not on file  Tobacco Use  . Smoking status: Current Every Day Smoker    Packs/day: 1.00    Types: Cigarettes  . Smokeless tobacco: Never Used  Substance and Sexual Activity  . Alcohol use: No  . Drug use: No  . Sexual activity: Yes    Birth control/protection: None  Other Topics Concern  . Not on file  Social History Narrative  . Not on file   Social Determinants of Health   Financial Resource Strain:   . Difficulty of Paying Living Expenses: Not on file  Food Insecurity:   . Worried About Programme researcher, broadcasting/film/video in the Last Year: Not on file  . Ran Out of Food in the Last Year: Not on file  Transportation Needs:   . Lack of Transportation (Medical): Not on file  . Lack of Transportation (Non-Medical): Not on file  Physical Activity:   . Days of Exercise per Week: Not on file  . Minutes of Exercise per Session: Not on file  Stress:   . Feeling of Stress : Not on file  Social Connections:   . Frequency of Communication with Friends and Family: Not on file  . Frequency of Social Gatherings with Friends and Family: Not on file  . Attends Religious Services: Not on file  . Active Member of Clubs or Organizations: Not on file  . Attends Banker Meetings: Not on file  .  Marital Status: Not on file  Intimate Partner Violence:   . Fear of Current or Ex-Partner: Not on file  . Emotionally Abused: Not on file  . Physically Abused: Not on file  . Sexually Abused: Not on file    Allergies:  No Known Allergies  Medications: Prior to Admission medications   Medication Sig Start Date End Date Taking? Authorizing Provider  amLODipine (NORVASC) 5 MG tablet Take 5 mg by mouth daily.   Yes [provider]  escitalopram (LEXAPRO) 10 MG tablet Take 1 tablet (10 mg total) by mouth daily. 10/05/19  Yes Vena Austria, MD  labetalol (NORMODYNE) 100 MG tablet Take 1 tablet (100 mg total) by mouth 2 (two) times daily. 09/13/19  Yes Vena Austria, MD  Multiple Vitamin (MULTIVITAMIN WITH MINERALS) TABS tablet Take 1 tablet by mouth daily.   Yes [provider]  metroNIDAZOLE (FLAGYL) 500 MG tablet Take 1 tablet (500 mg total) by mouth 2 (two) times daily for 7 days. 01/03/20 01/10/20  Vena Austria, MD  traZODone (DESYREL) 50 MG tablet Take 1 tablet (50 mg total) by mouth at bedtime as needed for sleep. Mary Gilbert not taking: Reported on 01/03/2020 10/05/19   Vena Austria, MD    Physical Exam Vitals: Blood pressure (!) 164/107, height 5\' 6"  (1.676 m), weight 279 lb (126.6 kg), not currently breastfeeding.  General: NAD, well nourished appears stated age HEENT: normocephalic, anicteric Pulmonary: No increased work of breathing Extremities: no edema, erythema, or tenderness Neurologic: Grossly intact, normal gait Psychiatric: mood appropriate, affect full   Assessment: 29 y.o. G3P0020 follow up AUB, trying to conceive, and bacterial vaginosis Plan: Problem List Items Addressed This Visit    None    Visit Diagnoses    Chronic vaginitis    -  Primary   Relevant Orders   NuSwab Vaginitis Plus (VG+)   Routine screening for STI (sexually transmitted infection)       Relevant Orders   NuSwab Vaginitis Plus (VG+)   HEP, RPR, HIV Panel   Abnormal uterine bleeding       Relevant Orders   PCOS panel (TSH+PRL+FSH+TESTT+LH+DHEA S...)      1) PCOS - suspect PCOS spectrum in regard to Mary Gilbert abnormal bleeding pattern.  No bleeding currently.  Will plan on letrozole 2.5mg  cycle with next menses.  Given my email to arrange.    In the case of anovulation, use of Clomid (clomiphen citrate) or Femara (letrazole) were discussed with the understanding the the later is an off-label, but well supported use.  Current recommendation are to use letrozole first line secondary to increased live birth rate compared to clomid for patients with PCOS ("Polycystic Ovarian Syndrome" ACOG Practice Bulletin 194 June 2018).  With either of  these drugs the risk of multiples increases from the standard population rate of 2% to approximately 10%, with higher order multiples possible but unlikely.  Both drugs may require some time to titrate to the appropriate dosage to ensure consistent ovulation.  Cycles will be limited to 6 cycles on each drug secondary to decreasing rates of conception after 6 cycles.  In addition should Mary Gilbert be started on ovulation induction with Clomid she was advised to discontinue the drug for any vision changes as this is a rare but potentially permanent side-effect if medication is continued.  Was counseled that letrozole may cause idiopathic bone pain that generally resolves.  Hot flashes, headaches, and nausea were mentioned as the most commonly encountered side-effects of both drugs.  We discussed  timing of intercourse as well as the use of ovulation predictor kits identify the Mary Gilbert's fertile window each month.     2) STI screening - requested and obtained  3) BV - nuswab sent as well as Rx flagyl.  ) Risk factors for bacterial vaginosis and candida infections discussed.  We discussed normal vaginal flora/microbiome.  Any factors that may alter the microbiome increase the risk of these opportunistic infections.  These include changes in pH, antibiotic exposures, diabetes, wet bathing suits etc.  We discussed that treatment is aimed at eradicating abnormal bacterial overgrowth and or yeast.  There may be some role for vaginal probiotics in restoring normal vaginal flora.    4) A total of 15 minutes were spent in face-to-face contact with the Mary Gilbert during this encounter with over half of that time devoted to counseling and coordination of care.  5) Pap smear 08/22/2019 LGSIL - Mary Gilbert pregnant at the time with subsequent miscarriage.  Repeat in 2 months at 6 month mark if remain abnormal colposcopy   6)   Return in about 1 day (around 01/04/2020) for Tomorrow for labs.     Malachy Mood, MD,  [facility], [facility] 01/03/2020, 5:13 PM

## 2020-01-04 ENCOUNTER — Other Ambulatory Visit: Payer: Medicaid Other

## 2020-01-04 ENCOUNTER — Telehealth: Payer: Self-pay | Admitting: Obstetrics and Gynecology

## 2020-01-04 DIAGNOSIS — N939 Abnormal uterine and vaginal bleeding, unspecified: Secondary | ICD-10-CM

## 2020-01-04 DIAGNOSIS — Z113 Encounter for screening for infections with a predominantly sexual mode of transmission: Secondary | ICD-10-CM

## 2020-01-04 NOTE — Telephone Encounter (Signed)
-----   Message from Vena Austria, MD sent at 01/03/2020  5:21 PM EST ----- Regarding: pap and BP check Pap and blood pressure check in 2 months

## 2020-01-04 NOTE — Telephone Encounter (Signed)
Called and left voice mail for patient to call back to be schedule °

## 2020-01-04 NOTE — Telephone Encounter (Signed)
Patient scheduled.

## 2020-01-09 LAB — NUSWAB VAGINITIS PLUS (VG+)
Atopobium vaginae: HIGH Score — AB
Candida albicans, NAA: NEGATIVE
Candida glabrata, NAA: NEGATIVE
Chlamydia trachomatis, NAA: NEGATIVE
Neisseria gonorrhoeae, NAA: NEGATIVE
Trich vag by NAA: NEGATIVE

## 2020-01-14 LAB — HEP, RPR, HIV PANEL
HIV Screen 4th Generation wRfx: NONREACTIVE
Hepatitis B Surface Ag: NEGATIVE
RPR Ser Ql: NONREACTIVE

## 2020-01-14 LAB — TSH+PRL+FSH+TESTT+LH+DHEA S...
17-Hydroxyprogesterone: 93 ng/dL
Androstenedione: 143 ng/dL (ref 41–262)
DHEA-SO4: 255 ug/dL (ref 84.8–378.0)
FSH: 3.6 m[IU]/mL
LH: 9.2 m[IU]/mL
Prolactin: 11.9 ng/mL (ref 4.8–23.3)
TSH: 0.622 u[IU]/mL (ref 0.450–4.500)
Testosterone, Free: 3.8 pg/mL (ref 0.0–4.2)
Testosterone: 32 ng/dL (ref 8–48)

## 2020-01-15 ENCOUNTER — Other Ambulatory Visit: Payer: Self-pay | Admitting: Obstetrics and Gynecology

## 2020-01-15 DIAGNOSIS — E282 Polycystic ovarian syndrome: Secondary | ICD-10-CM

## 2020-01-15 MED ORDER — LETROZOLE 2.5 MG PO TABS: 2.5000 mg | ORAL_TABLET | Freq: Every day | ORAL | 0 refills | Status: AC

## 2020-01-15 NOTE — Telephone Encounter (Signed)
Progesterone lab on 02/02/2020

## 2020-01-15 NOTE — Progress Notes (Signed)
LMP 01/13/2020 day 21 progesterone 02/02/2020

## 2020-01-16 NOTE — Telephone Encounter (Signed)
Patient is schedule ffor lab on 02/02/20 with Lab

## 2020-02-02 ENCOUNTER — Other Ambulatory Visit: Payer: Medicaid Other

## 2020-02-02 ENCOUNTER — Other Ambulatory Visit: Payer: Self-pay

## 2020-02-02 DIAGNOSIS — E282 Polycystic ovarian syndrome: Secondary | ICD-10-CM

## 2020-02-03 LAB — PROGESTERONE: Progesterone: 4.9 ng/mL

## 2020-02-09 ENCOUNTER — Other Ambulatory Visit: Payer: Self-pay | Admitting: Obstetrics and Gynecology

## 2020-02-09 MED ORDER — METRONIDAZOLE 500 MG PO TABS: 500.0000 mg | ORAL_TABLET | Freq: Two times a day (BID) | ORAL | 0 refills | Status: AC

## 2020-02-09 NOTE — Progress Notes (Signed)
Rx RF flagyl for BV sx.  

## 2020-02-15 ENCOUNTER — Other Ambulatory Visit: Payer: Self-pay | Admitting: Obstetrics and Gynecology

## 2020-02-15 DIAGNOSIS — N970 Female infertility associated with anovulation: Secondary | ICD-10-CM

## 2020-02-15 MED ORDER — LETROZOLE 2.5 MG PO TABS: 2.5000 mg | ORAL_TABLET | Freq: Every day | ORAL | 0 refills | Status: AC

## 2020-02-15 MED ORDER — MEDROXYPROGESTERONE ACETATE 10 MG PO TABS: 10.0000 mg | ORAL_TABLET | Freq: Every day | ORAL | 0 refills | Status: DC

## 2020-02-15 NOTE — Progress Notes (Signed)
LMP 02/15/2020 Cycle II letrzole 2.5 day 21 progesterone 3/10

## 2020-02-15 NOTE — Telephone Encounter (Signed)
Patient is added to lab schedule 03/06/20

## 2020-02-15 NOTE — Telephone Encounter (Signed)
Progesterone lab 3/10

## 2020-02-27 ENCOUNTER — Other Ambulatory Visit: Payer: Self-pay | Admitting: Advanced Practice Midwife

## 2020-02-27 DIAGNOSIS — B9689 Other specified bacterial agents as the cause of diseases classified elsewhere: Secondary | ICD-10-CM

## 2020-02-27 MED ORDER — METRONIDAZOLE 0.75 % VA GEL: 1.0000 | Freq: Every day | VAGINAL | 1 refills | Status: AC

## 2020-02-27 NOTE — Progress Notes (Unsigned)
Rx metro gel sent to patient pharmacy for recurrent BV symptoms. She was just treated with pills so switching to gel to see if that is more effective.

## 2020-03-06 ENCOUNTER — Other Ambulatory Visit: Payer: Medicaid Other

## 2020-03-06 ENCOUNTER — Ambulatory Visit: Payer: Medicaid Other | Admitting: Obstetrics and Gynecology

## 2020-04-03 ENCOUNTER — Telehealth: Payer: Self-pay | Admitting: Obstetrics & Gynecology

## 2020-04-03 NOTE — Telephone Encounter (Signed)
Phineas Real referring for Abnormal papsmear LGSIL. Called and left voicemail for patient to call back to be schedule

## 2020-04-25 ENCOUNTER — Other Ambulatory Visit: Payer: Self-pay

## 2020-04-25 ENCOUNTER — Other Ambulatory Visit (HOSPITAL_COMMUNITY)
Admission: RE | Admit: 2020-04-25 | Discharge: 2020-04-25 | Disposition: A | Discharge status: Home / Self Care | Payer: Managed Care, Other (non HMO) | Source: Ambulatory Visit | Attending: Obstetrics and Gynecology | Admitting: Obstetrics and Gynecology

## 2020-04-25 ENCOUNTER — Encounter: Payer: Self-pay | Admitting: Obstetrics and Gynecology

## 2020-04-25 ENCOUNTER — Ambulatory Visit (INDEPENDENT_AMBULATORY_CARE_PROVIDER_SITE_OTHER): Payer: No Typology Code available for payment source | Admitting: Obstetrics and Gynecology

## 2020-04-25 VITALS — BP 136/88 | Ht 66.0 in | Wt 275.0 lb

## 2020-04-25 DIAGNOSIS — R87612 Low grade squamous intraepithelial lesion on cytologic smear of cervix (LGSIL): Secondary | ICD-10-CM | POA: Diagnosis not present

## 2020-04-25 NOTE — Progress Notes (Signed)
   GYNECOLOGY CLINIC COLPOSCOPY PROCEDURE NOTE  29 y.o. [id number] here for colposcopy for low-grade squamous intraepithelial neoplasia (LGSIL - encompassing HPV,mild dysplasia,CIN I)  pap smear. Discussed underlying role for HPV infection in the development of cervical dysplasia, its natural history and progression/regression, need for surveillance.  Is the patient  pregnant: No LMP: Patient's last menstrual period was 04/12/2020. Smoking status:  reports that she has been smoking cigarettes. She has been smoking about 1.00 pack per day. She has never used smokeless tobacco.  Future fertility desired:  Yes  Patient given informed consent, signed copy in the chart, time out was performed.  The patient was position in dorsal lithotomy position. Speculum was placed the cervix was visualized.   After application of acetic acid colposcopic inspection of the cervix was undertaken.   Colposcopy adequate, full visualization of transformation zone: Yes no visible lesions; corresponding biopsies obtained (random 3 O'Clock biopsy) ECC specimen obtained:  Yes  All specimens were labeled and sent to pathology.   Patient was given post procedure instructions.  Will follow up pathology and manage accordingly.  Routine preventative health maintenance measures emphasized.  OBGyn Exam  Vena Austria, MD, [facility], [facility]

## 2020-04-26 ENCOUNTER — Other Ambulatory Visit: Payer: Self-pay | Admitting: Obstetrics and Gynecology

## 2020-04-26 LAB — SURGICAL PATHOLOGY

## 2020-04-26 MED ORDER — METRONIDAZOLE 500 MG PO TABS: 500.0000 mg | ORAL_TABLET | Freq: Two times a day (BID) | ORAL | 0 refills | Status: DC

## 2020-07-27 ENCOUNTER — Ambulatory Visit: Payer: Self-pay

## 2020-09-08 ENCOUNTER — Other Ambulatory Visit: Payer: Self-pay

## 2020-09-08 ENCOUNTER — Emergency Department
Admission: EM | Admit: 2020-09-08 | Discharge: 2020-09-08 | Disposition: A | Discharge status: Home / Self Care | Payer: No Typology Code available for payment source | Attending: Emergency Medicine | Admitting: Emergency Medicine

## 2020-09-08 DIAGNOSIS — R109 Unspecified abdominal pain: Secondary | ICD-10-CM | POA: Insufficient documentation

## 2020-09-08 DIAGNOSIS — Z5321 Procedure and treatment not carried out due to patient leaving prior to being seen by health care provider: Secondary | ICD-10-CM | POA: Insufficient documentation

## 2020-09-08 LAB — BASIC METABOLIC PANEL
Anion gap: 10 (ref 5–15)
BUN: 13 mg/dL (ref 6–20)
CO2: 26 mmol/L (ref 22–32)
Calcium: 8.8 mg/dL — ABNORMAL LOW (ref 8.9–10.3)
Chloride: 103 mmol/L (ref 98–111)
Creatinine, Ser: 0.64 mg/dL (ref 0.44–1.00)
GFR calc Af Amer: >60 mL/min (ref >60)
GFR calc non Af Amer: >60 mL/min (ref >60)
Glucose, Bld: 111 mg/dL — ABNORMAL HIGH (ref 70–99)
Potassium: 3.9 mmol/L (ref 3.5–5.1)
Sodium: 139 mmol/L (ref 135–145)

## 2020-09-08 LAB — URINALYSIS, COMPLETE (UACMP) WITH MICROSCOPIC
Bacteria, UA: NONE SEEN
Bilirubin Urine: NEGATIVE
Glucose, UA: NEGATIVE mg/dL
Ketones, ur: NEGATIVE mg/dL
Nitrite: NEGATIVE
Protein, ur: 100 mg/dL — AB
RBC / HPF: >50 RBC/hpf — ABNORMAL HIGH (ref 0–5)
Specific Gravity, Urine: 1.013 (ref 1.005–1.030)
WBC, UA: >50 WBC/hpf — ABNORMAL HIGH (ref 0–5)
pH: 6 (ref 5.0–8.0)

## 2020-09-08 LAB — CBC
HCT: 44.7 % (ref 36.0–46.0)
Hemoglobin: 15.3 g/dL — ABNORMAL HIGH (ref 12.0–15.0)
MCH: 30.4 pg (ref 26.0–34.0)
MCHC: 34.2 g/dL (ref 30.0–36.0)
MCV: 88.7 fL (ref 80.0–100.0)
Platelets: 273 10*3/uL (ref 150–400)
RBC: 5.04 MIL/uL (ref 3.87–5.11)
RDW: 12.8 % (ref 11.5–15.5)
WBC: 12 10*3/uL — ABNORMAL HIGH (ref 4.0–10.5)
nRBC: 0 % (ref 0.0–0.2)

## 2020-09-08 LAB — POCT PREGNANCY, URINE: Preg Test, Ur: NEGATIVE

## 2020-09-08 NOTE — ED Notes (Signed)
Patient observed walking out of ED lobby. °

## 2020-09-08 NOTE — ED Notes (Signed)
Pt called for vital signs from main ED waiting room, no answer.

## 2020-09-08 NOTE — ED Notes (Signed)
Have not seen patient return to ED lobby.

## 2020-09-08 NOTE — ED Triage Notes (Signed)
Patient reports left flank pain that radiates towards the front that woke her from sleep approximately 30 minutes ago.  States feel different from previous kidney stone pain.

## 2020-09-09 ENCOUNTER — Ambulatory Visit: Payer: Medicaid Other | Admitting: Obstetrics and Gynecology

## 2020-09-11 ENCOUNTER — Encounter: Payer: Self-pay | Admitting: Emergency Medicine

## 2020-09-11 ENCOUNTER — Ambulatory Visit
Admission: EM | Admit: 2020-09-11 | Discharge: 2020-09-11 | Disposition: A | Discharge status: Home / Self Care | Payer: No Typology Code available for payment source | Attending: Emergency Medicine | Admitting: Emergency Medicine

## 2020-09-11 ENCOUNTER — Other Ambulatory Visit: Payer: Self-pay

## 2020-09-11 ENCOUNTER — Ambulatory Visit: Payer: Self-pay

## 2020-09-11 DIAGNOSIS — N3000 Acute cystitis without hematuria: Secondary | ICD-10-CM

## 2020-09-11 DIAGNOSIS — N760 Acute vaginitis: Secondary | ICD-10-CM

## 2020-09-11 LAB — URINALYSIS, COMPLETE (UACMP) WITH MICROSCOPIC
Bilirubin Urine: NEGATIVE
Glucose, UA: NEGATIVE mg/dL
Hgb urine dipstick: NEGATIVE
Ketones, ur: NEGATIVE mg/dL
Nitrite: NEGATIVE
Protein, ur: NEGATIVE mg/dL
Specific Gravity, Urine: <1.005 — ABNORMAL LOW (ref 1.005–1.030)
pH: 5.5 (ref 5.0–8.0)

## 2020-09-11 LAB — WET PREP, GENITAL
Sperm: NONE SEEN
Trich, Wet Prep: NONE SEEN

## 2020-09-11 LAB — CHLAMYDIA/NGC RT PCR (ARMC ONLY)
Chlamydia Tr: NOT DETECTED
N gonorrhoeae: NOT DETECTED

## 2020-09-11 MED ORDER — METRONIDAZOLE 500 MG PO TABS: 500.0000 mg | ORAL_TABLET | Freq: Two times a day (BID) | ORAL | 0 refills | Status: AC

## 2020-09-11 MED ORDER — FLUCONAZOLE 150 MG PO TABS: ORAL_TABLET | ORAL | 0 refills | Status: DC

## 2020-09-11 MED ORDER — SULFAMETHOXAZOLE-TRIMETHOPRIM 800-160 MG PO TABS: 1.0000 | ORAL_TABLET | Freq: Two times a day (BID) | ORAL | 0 refills | Status: AC

## 2020-09-11 NOTE — Discharge Instructions (Signed)
Your urinalysis is consistent with a urinary tract infection.  We will send the urine for culture and call with results in a couple of days.  At this time start Bactrim DS antibiotic for UTI.  A vaginal swab shows that you have a BV as well as yeast infection.  These multiple infections could be due to your recent intercourse involving use of lotion intravaginally.  I would strongly discourage this in the future and you should only use pH balance products such as Summer's Eve or Vagisil.  Follow-up with our clinic as needed.  You should be seen again in our clinic or the ED if you develop fever, increased back/side pain or pelvic pain.  We will call tomorrow (and you can view your results through MyChart) with your gonorrhea and Chlamydia lab results.  If you are positive you will need to be treated.

## 2020-09-11 NOTE — ED Provider Notes (Signed)
[facility]    CSN: [id number] Arrival date & time: 09/11/20  1758      History   Chief Complaint Chief Complaint  Patient presents with  . Dysuria  . Vaginal Discharge    HPI Mary Gilbert is a 29 y.o. female.   Patient presents for 3-day history of dysuria.  She states that at the onset she developed left flank pain that has been moderate and intermittent.  She says that sometimes the flank pain radiates to his lower back.  She also says that it sometimes radiates to the left lower abdomen.  Patient also admits to vaginal discharge that is normal in coloration, but malodorous.  Patient states that 2 days before onset of symptoms she had intercourse with her female partner and he lubricated his penis with lotion.  She says that she is very sensitive and cannot use lubricants so he decided to use lotion.  Patient denies any fever, chills, weakness, significant abdominal/pelvic pain, irregular bleeding, possibility of pregnancy.  She states that she went to [facility] ED 3 days ago, but left due to the weight.  She says she had labs drawn and also a urine test, but did not stay for the results.  She comes into the clinic today because symptoms have continued.  Patient admits to a history of urinary tract infections renal stones and bacterial vaginosis.  There are no other complaints or concerns today.  HPI  Past Medical History:  Diagnosis Date  . Hypertension   . Kidney stones   . UTI (urinary tract infection)     Patient Active Problem List   Diagnosis Date Noted  . Hyperemesis 09/08/2019  . 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 Chronic hypertension affecting pregnancy 08/22/2019  . Obesity affecting pregnancy in first trimester 08/22/2019  . Dizygotic twins 08/22/2019  . Supervision of high risk pregnancy, antepartum 08/22/2019    Past Surgical History:  Procedure Laterality Date  . CHOLECYSTECTOMY    . LITHOTRIPSY      OB History    Gravida  3   Para        Term      Preterm      AB  2   Living        SAB  2   TAB      Ectopic      Multiple  1   Live Births               Home Medications    Prior to Admission medications   Medication Sig Start Date End Date Taking? Authorizing Provider  amLODipine (NORVASC) 5 MG tablet Take 5 mg by mouth daily.   Yes [provider]  escitalopram (LEXAPRO) 10 MG tablet Take 1 tablet (10 mg total) by mouth daily. 10/05/19  Yes Vena Austria, MD  fluconazole (DIFLUCAN) 150 MG tablet Take 1 tab PO q72h for yeast infection 09/11/20   Mary Friendly B, PA-C  metroNIDAZOLE (FLAGYL) 500 MG tablet Take 1 tablet (500 mg total) by mouth 2 (two) times daily for 7 days. 09/11/20 09/18/20  Shirlee Latch, PA-C  Multiple Vitamin (MULTIVITAMIN WITH MINERALS) TABS tablet Take 1 tablet by mouth daily.    [provider]  sulfamethoxazole-trimethoprim (BACTRIM DS) 800-160 MG tablet Take 1 tablet by mouth 2 (two) times daily for 7 days. 09/11/20 09/18/20  Shirlee Latch, PA-C    Family History Family History  Problem Relation Age of Onset  . Mental illness Mother   .  Heart disease Father   . Hypertension Father     Social History Social History   Tobacco Use  . Smoking status: Current Every Day Smoker    Packs/day: 1.00    Types: Cigarettes  . Smokeless tobacco: Never Used  Vaping Use  . Vaping Use: Never used  Substance Use Topics  . Alcohol use: No  . Drug use: No     Allergies   Trazodone and nefazodone   Review of Systems Review of Systems  Constitutional: Negative for chills, diaphoresis, fatigue and fever.  Cardiovascular: Negative for chest pain.  Gastrointestinal: Positive for abdominal pain. Negative for nausea and vomiting.  Genitourinary: Positive for dysuria, flank pain, pelvic pain and vaginal discharge. Negative for frequency, hematuria, urgency, vaginal bleeding and vaginal pain.  Musculoskeletal: Positive for back pain.  Skin: Negative for rash.   Neurological: Negative for weakness.     Physical Exam Triage Vital Signs ED Triage Vitals  Enc Vitals Group     BP 09/11/20 1832 (!) 141/94     Pulse Rate 09/11/20 1832 90     Resp 09/11/20 1832 18     Temp 09/11/20 1832 98.6 F (37 C)     Temp Source 09/11/20 1832 Oral     SpO2 09/11/20 1832 100 %     Weight 09/11/20 1830 280 lb (127 kg)     Height 09/11/20 1830 5\' 6"  (1.676 m)     Head Circumference --      Peak Flow --      Pain Score 09/11/20 1830 0     Pain Loc --      Pain Edu? --      Excl. in GC? --    No data found.  Updated Vital Signs BP (!) 141/94 (BP Location: Right Arm)   Pulse 90   Temp 98.6 F (37 C) (Oral)   Resp 18   Ht 5\' 6"  (1.676 m)   Wt 280 lb (127 kg)   LMP 09/08/2020   SpO2 100%   BMI 45.19 kg/m       Physical Exam Vitals and nursing note reviewed.  Constitutional:      General: She is not in acute distress.    Appearance: Normal appearance. She is not ill-appearing or toxic-appearing.  HENT:     Head: Normocephalic and atraumatic.  Eyes:     General: No scleral icterus.       Right eye: No discharge.        Left eye: No discharge.     Conjunctiva/sclera: Conjunctivae normal.  Cardiovascular:     Rate and Rhythm: Normal rate and regular rhythm.     Heart sounds: Normal heart sounds.  Pulmonary:     Effort: Pulmonary effort is normal. No respiratory distress.     Breath sounds: Normal breath sounds.  Abdominal:     Palpations: Abdomen is soft.     Tenderness: There is abdominal tenderness (mild TTP LLQ and suprapubic region). There is no right CVA tenderness, left CVA tenderness, guarding or rebound.  Musculoskeletal:     Cervical back: Neck supple.  Skin:    General: Skin is dry.  Neurological:     General: No focal deficit present.     Mental Status: She is alert. Mental status is at baseline.     Motor: No weakness.     Gait: Gait normal.  Psychiatric:        Mood and Affect: Mood normal.  Behavior: Behavior  normal.        Thought Content: Thought content normal.      UC Treatments / Results  Labs (all labs ordered are listed, but only abnormal results are displayed) Labs Reviewed  WET PREP, GENITAL - Abnormal; Notable for the following components:      Result Value   Yeast Wet Prep HPF POC PRESENT (*)    Clue Cells Wet Prep HPF POC PRESENT (*)    WBC, Wet Prep HPF POC FEW (*)    All other components within normal limits  URINALYSIS, COMPLETE (UACMP) WITH MICROSCOPIC - Abnormal; Notable for the following components:   Specific Gravity, Urine <1.005 (*)    Leukocytes,Ua SMALL (*)    Bacteria, UA FEW (*)    All other components within normal limits  URINE CULTURE  CHLAMYDIA/NGC RT PCR [facility] ONLY)    EKG   Radiology No results found.  Procedures Procedures (including critical care time)  Medications Ordered in UC Medications - No data to display  Initial Impression / Assessment and Plan / UC Course  I have reviewed the triage vital signs and the nursing notes.  Pertinent labs & imaging results that were available during my care of the patient were reviewed by me and considered in my medical decision making (see chart for details).   UA today shows small leukocytes.  Vaginal swab shows positive yeast and clue cells.  Urine sent for culture.  Vaginal swab taken and sent for gonorrhea and Chlamydia testing.   Reviewed labs from 09/08/2020 from [facility].  Urine pregnancy test negative.  UA shows moderate leukocytes and blood.  CBC shows mildly elevated white blood cells but 12,000  All vital signs stable.  Patient does not exhibit CVA tenderness.  There is mild left lower quadrant tenderness.  Treating patient for suspected UTI and acute vaginitis.  Sent Bactrim DS as well as Flagyl and Diflucan.  Advised patient will call with [facility] committee results and treat if positive.  ED precautions discussed with patient.  . Final Clinical Impressions(s) / UC Diagnoses    Final diagnoses:  Acute vaginitis  Acute cystitis without hematuria     Discharge Instructions     Your urinalysis is consistent with a urinary tract infection.  We will send the urine for culture and call with results in a couple of days.  At this time start Bactrim DS antibiotic for UTI.  A vaginal swab shows that you have a BV as well as yeast infection.  These multiple infections could be due to your recent intercourse involving use of lotion intravaginally.  I would strongly discourage this in the future and you should only use pH balance products such as Summer's Eve or Vagisil.  Follow-up with our clinic as needed.  You should be seen again in our clinic or the ED if you develop fever, increased back/side pain or pelvic pain.  We will call tomorrow (and you can view your results through MyChart) with your gonorrhea and Chlamydia lab results.  If you are positive you will need to be treated.    ED Prescriptions    Medication Sig Dispense Auth. Provider   sulfamethoxazole-trimethoprim (BACTRIM DS) 800-160 MG tablet Take 1 tablet by mouth 2 (two) times daily for 7 days. 14 tablet Mary Friendly B, PA-C   metroNIDAZOLE (FLAGYL) 500 MG tablet Take 1 tablet (500 mg total) by mouth 2 (two) times daily for 7 days. 14 tablet Mary Friendly B, PA-C   fluconazole (  DIFLUCAN) 150 MG tablet Take 1 tab PO q72h for yeast infection 2 tablet Shirlee LatchEaves, Nimesh Riolo B, PA-C     PDMP not reviewed this encounter.   Shirlee Latchaves, Melannie Metzner B, PA-C 09/11/20 1932

## 2020-09-11 NOTE — ED Triage Notes (Signed)
Patient states she had sexual intercourse but she states the person put lotion on his penis and now she is having dysuria and left side pain that started on Sunday. She reports white vaginal discharge.

## 2020-09-12 ENCOUNTER — Ambulatory Visit: Payer: Self-pay

## 2020-09-13 LAB — URINE CULTURE: Culture: >=100000 — AB

## 2020-10-17 ENCOUNTER — Other Ambulatory Visit: Payer: Self-pay | Admitting: Advanced Practice Midwife

## 2020-10-17 DIAGNOSIS — N760 Acute vaginitis: Secondary | ICD-10-CM

## 2020-10-17 DIAGNOSIS — B9689 Other specified bacterial agents as the cause of diseases classified elsewhere: Secondary | ICD-10-CM

## 2021-05-02 ENCOUNTER — Encounter: Payer: Self-pay | Admitting: Emergency Medicine

## 2021-06-25 IMAGING — US OBSTETRIC <14 WK US AND TRANSVAGINAL OB US
1 series · 14 of 28 positions shown · non-contrast
Comparison: 08/04/2019

CLINICAL DATA: Pelvic pain for 5 days.

EXAM:
TWIN OBSTETRIC <14WK US AND TRANSVAGINAL OB US

[Series 1: obstetric <14 wk us and transvaginal ob us · 14 of 83 slices shown]
[im 4/83]
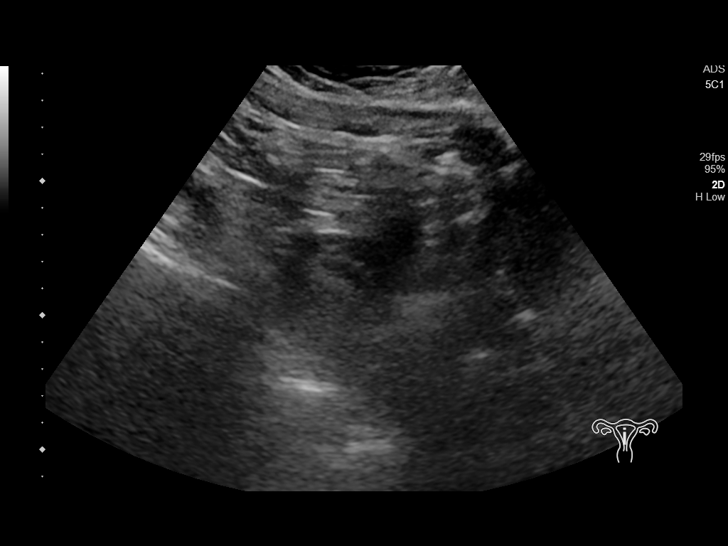
[im 10/83]
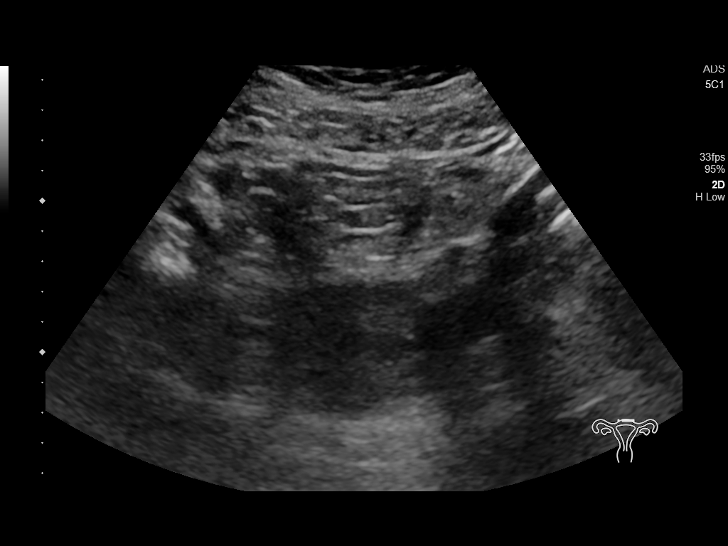
[im 16/83]
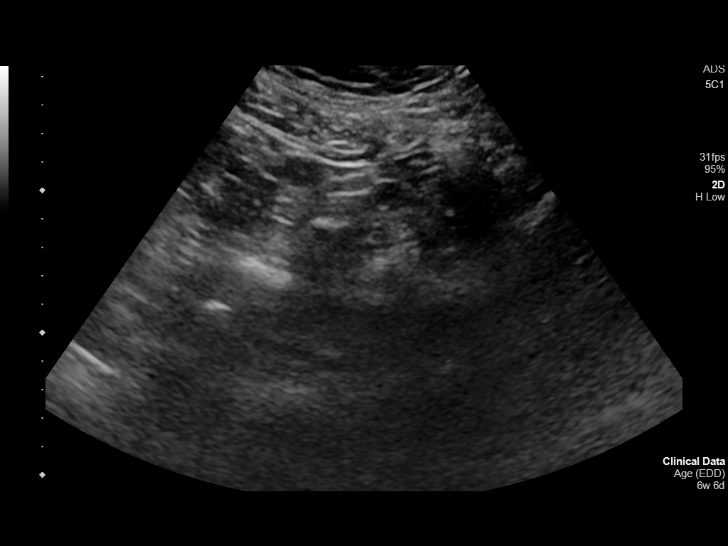
[im 22/83]
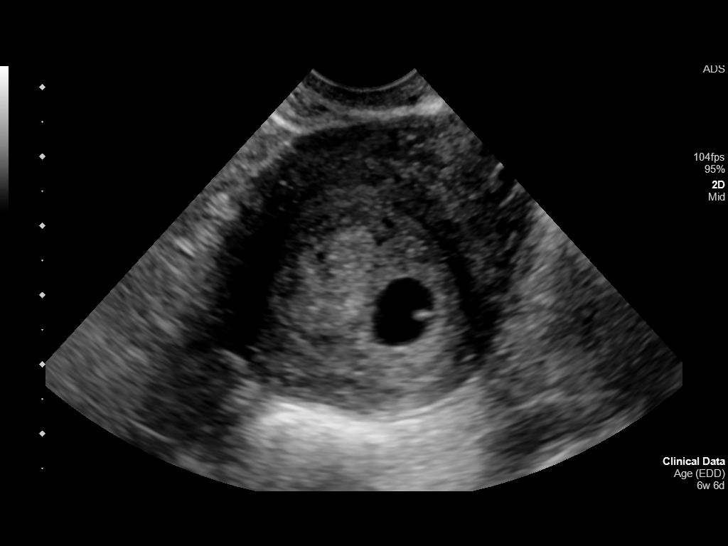
[im 28/83]
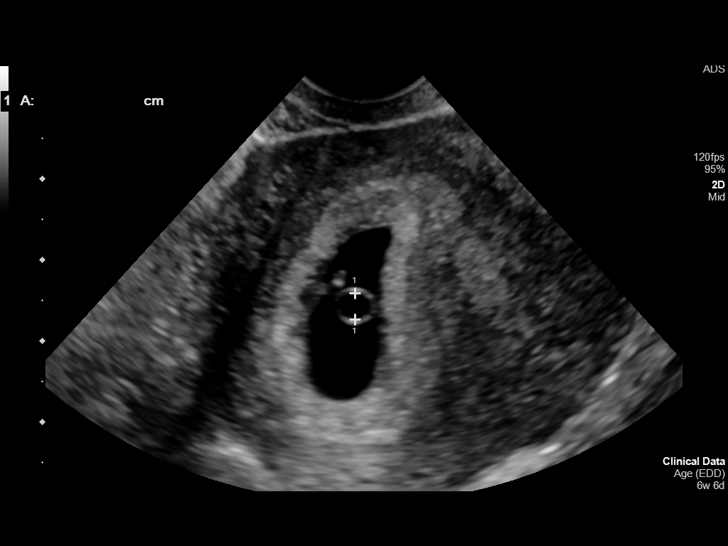
[im 34/83]
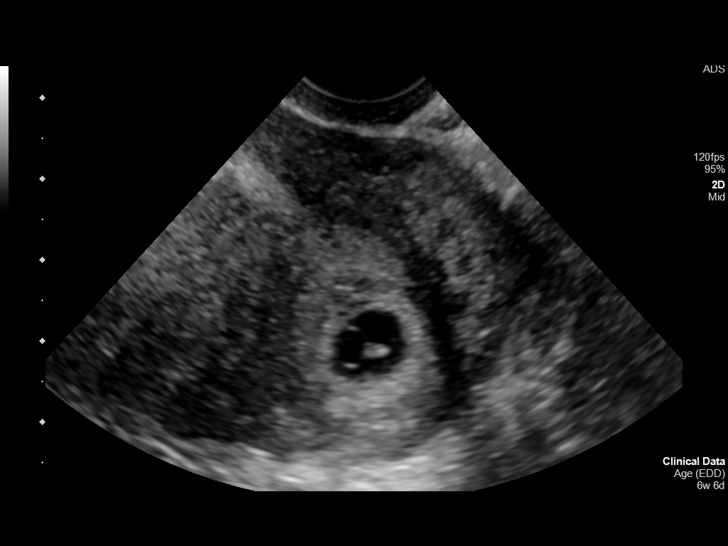
[im 40/83]
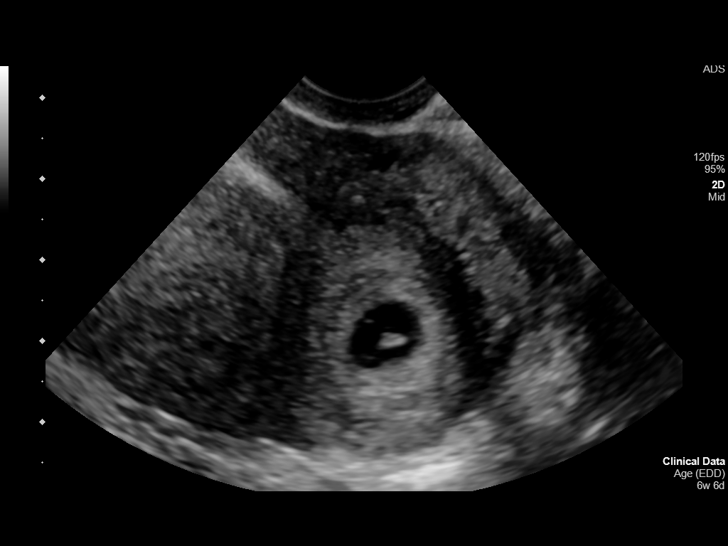
[im 46/83]
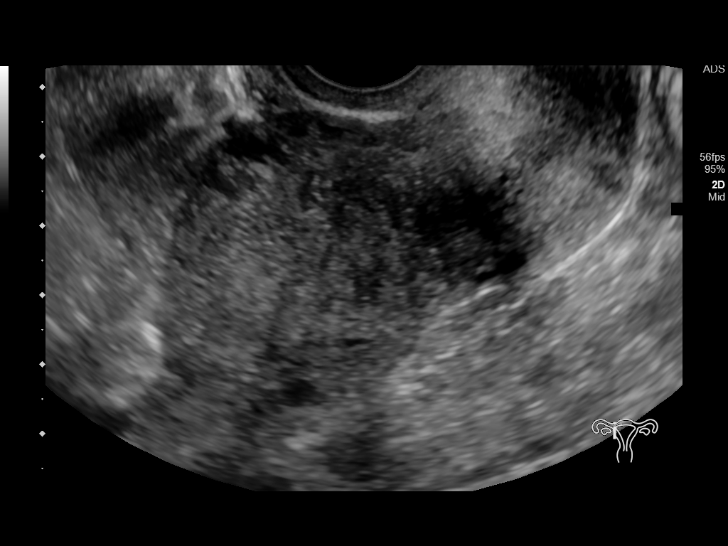
[im 52/83]
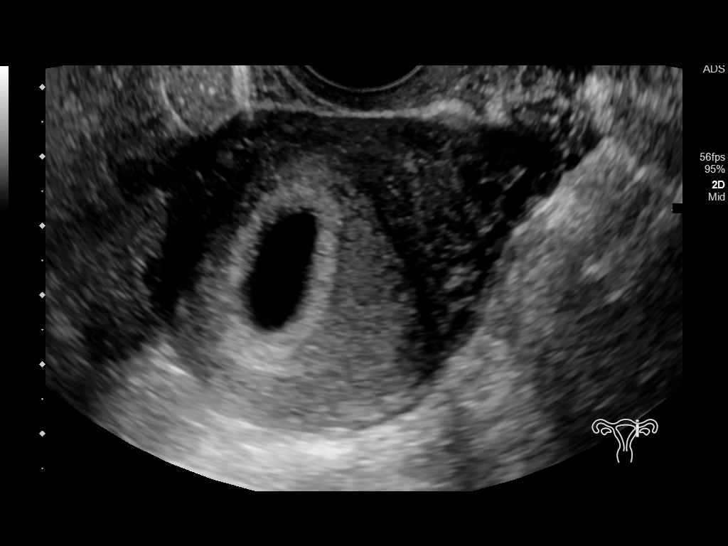
[im 58/83]
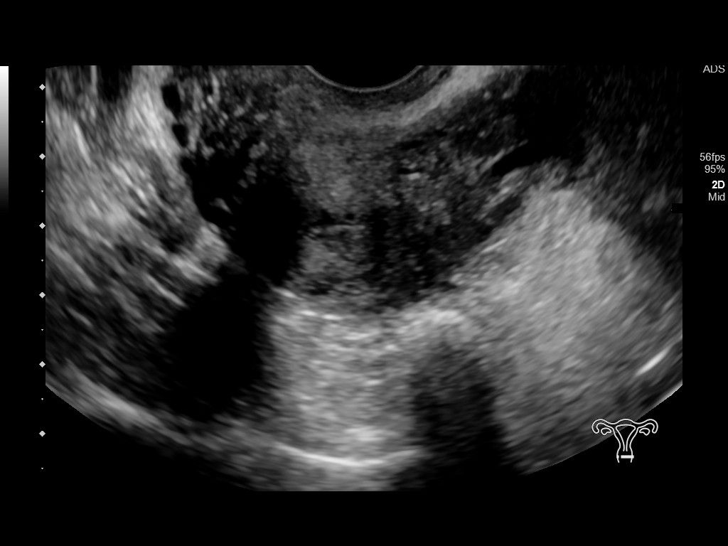
[im 64/83]
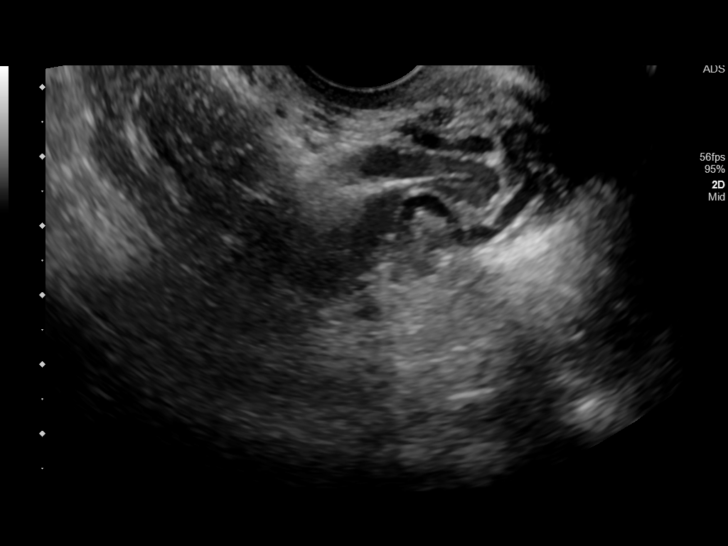
[im 70/83]
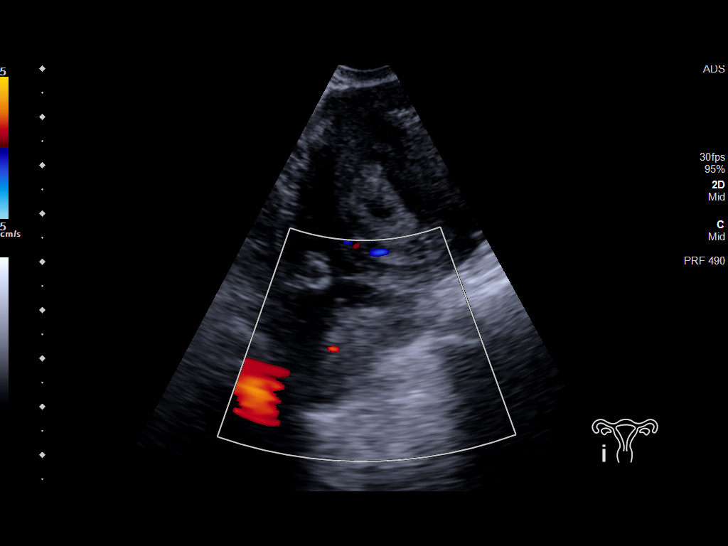
[im 76/83]
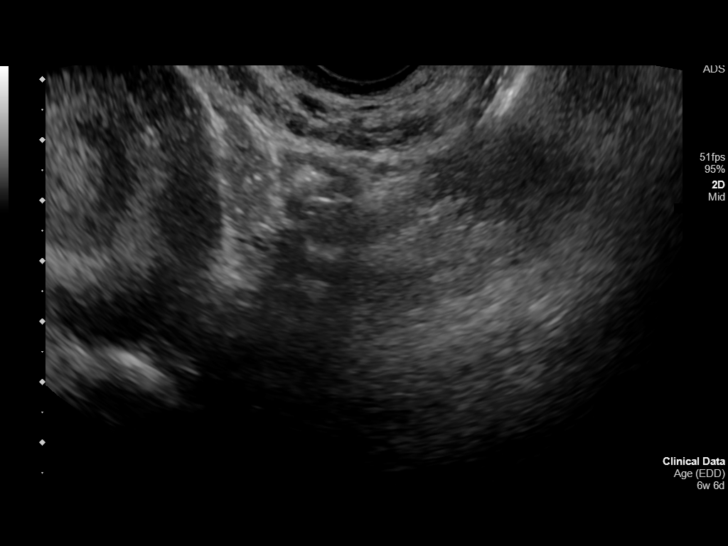
[im 83/83]
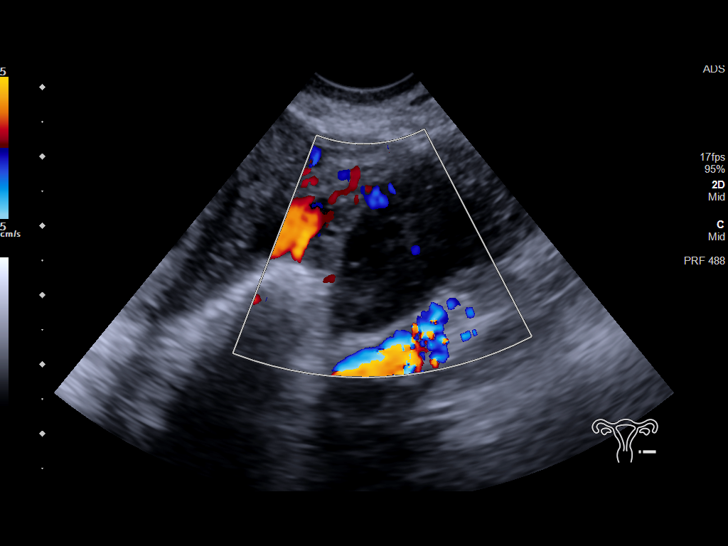

[14 of 28 positions shown; findings below may reference images not displayed]

FINDINGS: Number of IUPs:  2

Chorionicity/Amnionicity:  Dichorionic-diamniotic (thick membrane)

TWIN 1

Yolk sac:  Visualized.

Embryo:  Visualized.

Cardiac Activity: Visualized.

Heart Rate: 121 bpm

CRL: 5.7 mm   6 w 3 d                  US EDC: 04/06/2020

TWIN 2

Yolk sac:  Visualized.

Embryo:  Visualized.

Cardiac Activity: Visualized.

Heart Rate: 118 bpm

CRL: 3.3 mm   6 w 0 d                  US EDC: 04/09/2020

Subchorionic hemorrhage:  None visualized.

Maternal uterus/adnexae: Normal appearance of the ovaries. Question
2.8 cm fibroid in the left mid uterine body.
IMPRESSION: Live twin intrauterine gestation.

No evidence of subchorionic hemorrhage.

Question 2.8 cm fibroid (versus injuring contraction) in the mid
left uterine body.

## 2021-07-22 ENCOUNTER — Ambulatory Visit
Admission: EM | Admit: 2021-07-22 | Discharge: 2021-07-22 | Disposition: A | Discharge status: Home / Self Care | Payer: BLUE CROSS/BLUE SHIELD | Attending: Emergency Medicine | Admitting: Emergency Medicine

## 2021-07-22 ENCOUNTER — Other Ambulatory Visit: Payer: Self-pay

## 2021-07-22 DIAGNOSIS — R21 Rash and other nonspecific skin eruption: Secondary | ICD-10-CM | POA: Diagnosis not present

## 2021-07-22 MED ORDER — HYDROXYZINE HCL 25 MG PO TABS: 25.0000 mg | ORAL_TABLET | Freq: Four times a day (QID) | ORAL | 0 refills | Status: DC

## 2021-07-22 MED ORDER — PREDNISONE 10 MG (21) PO TBPK: ORAL_TABLET | ORAL | 0 refills | Status: DC

## 2021-07-22 NOTE — ED Triage Notes (Signed)
Patient states that she has been having a rash x 3 months. States that has been noticing the rash all over. 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

## 2021-07-22 NOTE — Discharge Instructions (Addendum)
Start the prednisone in the morning and take it each morning at breakfast for 6 days.  Use the Hydroxyzine at bedtime and use OTC Allegra 180 mg, Zyrtec 10 mg, or Claritin 10 mg during the day for itching.  Take Pepcid 20 mg twice daily to help with itching as well.  If your symptoms do not resolve, or worsen you need to see dermatology or an allergist.

## 2021-07-22 NOTE — ED Provider Notes (Signed)
[facility]    CSN: [id number] Arrival date & time: 07/22/21  1820      History   Chief Complaint Chief Complaint  Patient presents with   Rash    HPI Adilene Areola is a 30 y.o. female.   HPI  30 year old female here for evaluation of rash.  Patient reports that she has been experiencing a generalized itchy red rash on both of her arms, her chest, and her legs for the past 3 months.  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  She denies new personal hygiene products, laundry detergent, clothing, medications, or supplements.  She is not having any shortness of breath or difficulty breathing.  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 Authorizing Provider  amLODipine (NORVASC) 5 MG tablet Take 5 mg by mouth daily.   Yes [provider]  hydrOXYzine (ATARAX/VISTARIL) 25 MG tablet Take 1 tablet (25 mg total) by mouth every 6 (six) hours. 07/22/21  Yes Becky Augusta, NP  predniSONE (STERAPRED UNI-PAK 21 TAB) 10 MG (21) TBPK tablet Take 6 tablets  on day 1, 5 tablets day 2, 4 tablets day 3, 3 tablets day 4, 2 tablets day 5, 1 tablet day 6 07/22/21  Yes Becky Augusta, NP    Family History Family History  Problem Relation Age of Onset   Mental illness Mother    Heart disease Father    Hypertension Father     Social History Social History   Tobacco Use   Smoking status: Every Day    Packs/day: 1.00    Types: Cigarettes   Smokeless tobacco: Never  Vaping Use   Vaping Use: Never used  Substance Use Topics   Alcohol use: No   Drug use: No     Allergies   Trazodone and nefazodone   Review of Systems Review of Systems  Constitutional:  Negative for activity change, appetite change and fever.  HENT:  Negative for facial swelling and trouble swallowing.   Respiratory:  Negative for shortness of breath.   Skin:  Positive for rash.  Hematological: Negative.     Physical Exam Triage Vital Signs ED Triage Vitals  Enc Vitals Group     BP 07/22/21 1839 (!) 148/103     Pulse  Rate 07/22/21 1839 79     Resp 07/22/21 1839 18     Temp 07/22/21 1839 98.6 F (37 C)     Temp Source 07/22/21 1839 Oral     SpO2 07/22/21 1839 100 %     Weight 07/22/21 1836 270 lb (122.5 kg)     Height 07/22/21 1836 5\' 6"  (1.676 m)     Head Circumference --      Peak Flow --      Pain Score 07/22/21 1836 0     Pain Loc --      Pain Edu? --      Excl. in GC? --    No data found.  Updated Vital Signs BP (!) 148/103 (BP Location: Right Arm)   Pulse 79   Temp 98.6 F (37 C) (Oral)   Resp 18   Ht 5\' 6"  (1.676 m)   Wt 270 lb (122.5 kg)   LMP 07/08/2021   SpO2 100%   BMI 43.58 kg/m   Visual Acuity Right Eye Distance:   Left Eye Distance:   Bilateral Distance:    Right Eye Near:   Left Eye Near:    Bilateral Near:     Physical Exam Vitals and nursing note reviewed.  Constitutional:      General: She is not in acute distress.    Appearance: Normal appearance. She is obese. She is not ill-appearing.  Cardiovascular:     Rate  and Rhythm: Normal rate and regular rhythm.     Pulses: Normal pulses.     Heart sounds: Normal heart sounds. No murmur heard.   No gallop.  Pulmonary:     Effort: Pulmonary effort is normal.     Breath sounds: Normal breath sounds. No wheezing, rhonchi or rales.  Skin:    General: Skin is warm.     Capillary Refill: Capillary refill takes less than 2 seconds.     Findings: Rash present.  Neurological:     General: No focal deficit present.     Mental Status: She is alert and oriented to person, place, and time.  Psychiatric:        Mood and Affect: Mood normal.        Behavior: Behavior normal.        Thought Content: Thought content normal.        Judgment: Judgment normal.     UC Treatments / Results  Labs (all labs ordered are listed, but only abnormal results are displayed) Labs Reviewed - No data to display  EKG   Radiology No results found.  Procedures Procedures (including critical care time)  Medications Ordered in UC Medications - No data to display  Initial Impression / Assessment and Plan / UC Course  I have reviewed the triage vital signs and the nursing notes.  Pertinent labs & imaging results that were available during my care of the patient were reviewed by me and considered in my medical decision making (see chart for details).  Patient is a pleasant, nontoxic-appearing 30 year old female here for evaluation of rash as outlined in HPI above.  Patient's physical exam reveals a diffuse, macular, erythematous rash on both upper arms, her dcolletage, and abdomen.  She indicates she also has similar lesions on both of her thighs.  There is no elevation to the rash and there are no vesicular lesions.  Etiology of the rash is unclear but it may be result of something in the new work environment where she is working.  We will treat patient with prednisone pack, hydroxyzine as she says Benadryl is not helping her, and Pepcid is that this is an allergic reaction.   Patient advised that if her symptoms not improve, or they worsen, she should follow-up with dermatology or an allergist.   Final Clinical Impressions(s) / UC Diagnoses   Final diagnoses:  Rash and nonspecific skin eruption     Discharge Instructions      Start the prednisone in the morning and take it each morning at breakfast for 6 days.  Use the Hydroxyzine at bedtime and use OTC Allegra 180 mg, Zyrtec 10 mg, or Claritin 10 mg during the day for itching.  Take Pepcid 20 mg twice daily to help with itching as well.  If your symptoms do not resolve, or worsen you need to see dermatology or an allergist.      ED Prescriptions     Medication Sig Dispense Auth. Provider   hydrOXYzine (ATARAX/VISTARIL) 25 MG tablet Take 1 tablet (25 mg total) by mouth every 6 (six) hours. 12 tablet Becky Augusta, NP   predniSONE (STERAPRED UNI-PAK 21 TAB) 10 MG (21) TBPK tablet Take 6 tablets on day 1, 5 tablets day 2, 4 tablets day 3, 3 tablets day 4, 2 tablets day 5, 1 tablet day 6 21 tablet Becky Augusta, NP      PDMP not reviewed this encounter.   Becky Augusta, NP 07/22/21 [phone]

## 2021-08-02 ENCOUNTER — Emergency Department
Admission: EM | Admit: 2021-08-02 | Discharge: 2021-08-02 | Disposition: A | Discharge status: Home / Self Care | Payer: BLUE CROSS/BLUE SHIELD | Attending: Emergency Medicine | Admitting: Emergency Medicine

## 2021-08-02 ENCOUNTER — Other Ambulatory Visit: Payer: Self-pay

## 2021-08-02 DIAGNOSIS — T7840XA Allergy, unspecified, initial encounter: Secondary | ICD-10-CM | POA: Insufficient documentation

## 2021-08-02 DIAGNOSIS — R21 Rash and other nonspecific skin eruption: Secondary | ICD-10-CM | POA: Insufficient documentation

## 2021-08-02 DIAGNOSIS — F17210 Nicotine dependence, cigarettes, uncomplicated: Secondary | ICD-10-CM | POA: Insufficient documentation

## 2021-08-02 DIAGNOSIS — Z79899 Other long term (current) drug therapy: Secondary | ICD-10-CM | POA: Diagnosis not present

## 2021-08-02 DIAGNOSIS — J029 Acute pharyngitis, unspecified: Secondary | ICD-10-CM | POA: Insufficient documentation

## 2021-08-02 DIAGNOSIS — I10 Essential (primary) hypertension: Secondary | ICD-10-CM | POA: Insufficient documentation

## 2021-08-02 LAB — COMPREHENSIVE METABOLIC PANEL
ALT: 20 U/L (ref 0–44)
AST: 20 U/L (ref 15–41)
Albumin: 3.5 g/dL (ref 3.5–5.0)
Alkaline Phosphatase: 77 U/L (ref 38–126)
Anion gap: 8 (ref 5–15)
BUN: 11 mg/dL (ref 6–20)
CO2: 26 mmol/L (ref 22–32)
Calcium: 8.5 mg/dL — ABNORMAL LOW (ref 8.9–10.3)
Chloride: 105 mmol/L (ref 98–111)
Creatinine, Ser: 0.67 mg/dL (ref 0.44–1.00)
GFR, Estimated: >60 mL/min (ref >60)
Glucose, Bld: 103 mg/dL — ABNORMAL HIGH (ref 70–99)
Potassium: 4 mmol/L (ref 3.5–5.1)
Sodium: 139 mmol/L (ref 135–145)
Total Bilirubin: 0.7 mg/dL (ref 0.3–1.2)
Total Protein: 6.9 g/dL (ref 6.5–8.1)

## 2021-08-02 LAB — MONONUCLEOSIS SCREEN: Mono Screen: NEGATIVE

## 2021-08-02 LAB — GROUP A STREP BY PCR: Group A Strep by PCR: NOT DETECTED

## 2021-08-02 MED ORDER — SODIUM CHLORIDE 0.9 % IV BOLUS
1000.0000 mL | Freq: Once | INTRAVENOUS | Status: AC
  Administered 2021-08-02: 1000 mL via INTRAVENOUS

## 2021-08-02 MED ORDER — DIPHENHYDRAMINE HCL 50 MG/ML IJ SOLN
25.0000 mg | Freq: Once | INTRAMUSCULAR | Status: AC
  Administered 2021-08-02: 25 mg via INTRAVENOUS
  Filled 2021-08-02: qty 1

## 2021-08-02 MED ORDER — AMOXICILLIN 875 MG PO TABS: 875.0000 mg | ORAL_TABLET | Freq: Two times a day (BID) | ORAL | 0 refills | Status: DC

## 2021-08-02 MED ORDER — FAMOTIDINE IN NACL 20-0.9 MG/50ML-% IV SOLN
20.0000 mg | Freq: Once | INTRAVENOUS | Status: AC
  Administered 2021-08-02: 20 mg via INTRAVENOUS
  Filled 2021-08-02: qty 50

## 2021-08-02 MED ORDER — METHYLPREDNISOLONE SODIUM SUCC 125 MG IJ SOLR
125.0000 mg | Freq: Once | INTRAMUSCULAR | Status: AC
  Administered 2021-08-02: 125 mg via INTRAVENOUS
  Filled 2021-08-02: qty 2

## 2021-08-02 MED ORDER — PREDNISONE 10 MG (21) PO TBPK: ORAL_TABLET | ORAL | 0 refills | Status: DC

## 2021-08-02 NOTE — ED Provider Notes (Signed)
[facility] Emergency Department Provider Note  ____________________________________________   Event Date/Time   First MD Initiated Contact with Patient 08/02/21 [phone]     (approximate)  I have reviewed the triage vital signs and the nursing notes.   HISTORY  Chief Complaint Rash    HPI Mary Gilbert is a 30 y.o. female presents emergency department complaining of a rash all over her face chest and neck and some on the extremities.  Patient states that she had a sore throat about 3 weeks ago.  Went to the doctor and had a negative COVID/strep test.  States she took a sleep medicine 3 days ago called sleep 3.  States she has now broken out in a rash and thinks she is reacting to the medication.  No fever or chills.  No chest pain or shortness of breath.  Patient does work in a long-term care facility.  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 Authorizing Provider  amoxicillin (AMOXIL) 875 MG tablet Take 1 tablet (875 mg total) by mouth 2 (two) times daily. 08/02/21  Yes Riannah Stagner, Roselyn Bering, PA-C  predniSONE (STERAPRED UNI-PAK 21 TAB) 10 MG (21) TBPK tablet Take 6 pills on day one then decrease by 1 pill each day 08/02/21  Yes Jaelin Devincentis, Roselyn Bering, PA-C  amLODipine (NORVASC) 5 MG tablet Take 5 mg by mouth daily.    [provider]  hydrOXYzine (ATARAX/VISTARIL) 25 MG  tablet Take 1 tablet (25 mg total) by mouth every 6 (six) hours. 07/22/21   Becky Augusta, NP    Allergies Trazodone and nefazodone  Family History  Problem Relation Age of Onset   Mental illness Mother    Heart disease Father    Hypertension Father     Social History Social History   Tobacco Use   Smoking status: Every Day    Packs/day: 1.00    Types: Cigarettes   Smokeless tobacco: Never  Vaping Use   Vaping Use: Never used  Substance Use Topics   Alcohol use: No   Drug use: No    Review of Systems  Constitutional: No fever/chills Eyes: No visual changes. ENT: Positive sore throat. Respiratory: Denies cough Cardiovascular: Denies chest pain Gastrointestinal: Denies abdominal pain Genitourinary: Negative for dysuria. Musculoskeletal: Negative for back pain. Skin: Positive for rash. Psychiatric: no mood changes,     ____________________________________________   PHYSICAL EXAM:  VITAL SIGNS: ED Triage Vitals  Enc Vitals Group     BP 08/02/21 0801 (!) 141/96     Pulse Rate 08/02/21 0801 90     Resp 08/02/21 0801 16     Temp 08/02/21 0801 98 F (36.7 C)     Temp Source 08/02/21 0801 Oral     SpO2 08/02/21 0801 100 %     Weight 08/02/21 0802 264 lb (119.7 kg)     Height  08/02/21 0802 5\' 6"  (1.676 m)     Head Circumference --      Peak Flow --      Pain Score 08/02/21 0802 5     Pain Loc --      Pain Edu? --      Excl. in GC? --     Constitutional: Alert and oriented. Well appearing and in no acute distress. Eyes: Conjunctivae are normal.  Head: Atraumatic. Nose: No congestion/rhinnorhea. Mouth/Throat: Mucous membranes are moist.   Neck:  supple no lymphadenopathy noted Cardiovascular: Normal rate, regular rhythm. Heart sounds are normal Respiratory: Normal respiratory effort.  No retractions, lungs c t a  GU: deferred Musculoskeletal: FROM all extremities, warm and well perfused Neurologic:  Normal speech and language.  Skin:  Skin is warm, dry  and intact.  Diffuse red sandpaperlike rash noted on the neck chest abdomen and back.  Does not appear to be scabies.  Looks more typical of either scarlet fever or an allergic drug reaction.   Psychiatric: Mood and affect are normal. Speech and behavior are normal.  ____________________________________________   LABS (all labs ordered are listed, but only abnormal results are displayed)  Labs Reviewed  COMPREHENSIVE METABOLIC PANEL - Abnormal; Notable for the following components:      Result Value   Glucose, Bld 103 (*)    Calcium 8.5 (*)    All other components within normal limits  GROUP A STREP BY PCR  MONONUCLEOSIS SCREEN   ____________________________________________   ____________________________________________  RADIOLOGY    ____________________________________________   PROCEDURES  Procedure(s) performed: No  Procedures    ____________________________________________   INITIAL IMPRESSION / ASSESSMENT AND PLAN / ED COURSE  Pertinent labs & imaging results that were available during my care of the patient were reviewed by me and considered in my medical decision making (see chart for details).   Patient is a 30 year old female presents emergency department with a rash.  See HPI.  Physical exam shows patient be stable at this time  DDx: Scarlet fever, allergic reaction, urticaria  Allergy protocol started.  Strep test, monotest also ordered along with basic labs.  Labs are reassuring.  Strep test and monotest are both negative.  Blood cell count and metabolic panel are normal  Did explain the findings to the patient.  She is somewhat better after the medications.  She be given a steroid pack and amoxicillin.  Have concerns that she has had the sore throat for 3 weeks.  She is denying any possible STD in the throat.  She agrees to treatment plan.  Discharged stable condition.  Mary Gilbert was evaluated in Emergency Department on 08/02/2021 for the  symptoms described in the history of present illness. She was evaluated in the context of the global COVID-19 pandemic, which necessitated consideration that the patient might be at risk for infection with the SARS-CoV-2 virus that causes COVID-19. Institutional protocols and algorithms that pertain to the evaluation of patients at risk for COVID-19 are in a state of rapid change based on information released by regulatory bodies including the CDC and federal and state organizations. These policies and algorithms were followed during the patient's care in the ED.    As part of my medical decision making, I reviewed the following data within the electronic MEDICAL RECORD NUMBER Nursing notes reviewed and incorporated, Labs reviewed , Old chart reviewed, Notes from prior ED visits, and [place] Controlled Substance Database  ____________________________________________   FINAL CLINICAL IMPRESSION(S) / ED DIAGNOSES  Final  diagnoses:  Allergic reaction, initial encounter  Acute pharyngitis, unspecified etiology      NEW MEDICATIONS STARTED DURING THIS VISIT:  New Prescriptions   AMOXICILLIN (AMOXIL) 875 MG TABLET    Take 1 tablet (875 mg total) by mouth 2 (two) times daily.   PREDNISONE (STERAPRED UNI-PAK 21 TAB) 10 MG (21) TBPK TABLET    Take 6 pills on day one then decrease by 1 pill each day     Note:  This document was prepared using Dragon voice recognition software and may include unintentional dictation errors.    Faythe Ghee, PA-C 08/02/21 1052    Georga Hacking, MD 08/02/21 [phone]

## 2021-08-02 NOTE — Discharge Instructions (Addendum)
Start your steroid pack tomorrow.  Start your amoxicillin today Follow-up with your regular doctor if not improving in 2 to 3 days.  Return emergency department worsening.  You may also want to see a dermatologist if this continues into the next week. You may also take Benadryl for itching.

## 2021-08-02 NOTE — ED Triage Notes (Signed)
Pt states she started with a rash on her abdomen after taking a new sleeping pill OTC- pt states it is itchy and painful, pt states it has not spread to her chest and neck since Friday AM- pt denies any allergies

## 2021-08-02 NOTE — ED Notes (Signed)
Patient verbalizes understanding of discharge instructions. Opportunity for questioning and answers were provided. Armband removed by staff, pt discharged from ED. Ambulated out to lobby  

## 2021-08-02 NOTE — ED Triage Notes (Signed)
First nurse note: Pt presents c/o rash to chest. Reports took "sleeping pill" on Thursday and woke up the next day with a rash. Also reports sore throat x1 month.

## 2021-11-05 ENCOUNTER — Other Ambulatory Visit: Payer: Self-pay

## 2021-11-05 ENCOUNTER — Emergency Department
Admission: EM | Admit: 2021-11-05 | Discharge: 2021-11-05 | Disposition: A | Discharge status: Home / Self Care | Payer: BLUE CROSS/BLUE SHIELD | Attending: Emergency Medicine | Admitting: Emergency Medicine

## 2021-11-05 DIAGNOSIS — F17210 Nicotine dependence, cigarettes, uncomplicated: Secondary | ICD-10-CM | POA: Insufficient documentation

## 2021-11-05 DIAGNOSIS — B3731 Acute candidiasis of vulva and vagina: Secondary | ICD-10-CM | POA: Insufficient documentation

## 2021-11-05 DIAGNOSIS — A749 Chlamydial infection, unspecified: Secondary | ICD-10-CM

## 2021-11-05 DIAGNOSIS — I10 Essential (primary) hypertension: Secondary | ICD-10-CM | POA: Insufficient documentation

## 2021-11-05 DIAGNOSIS — M79672 Pain in left foot: Secondary | ICD-10-CM | POA: Insufficient documentation

## 2021-11-05 DIAGNOSIS — A5602 Chlamydial vulvovaginitis: Secondary | ICD-10-CM | POA: Insufficient documentation

## 2021-11-05 DIAGNOSIS — Z79899 Other long term (current) drug therapy: Secondary | ICD-10-CM | POA: Insufficient documentation

## 2021-11-05 LAB — CHLAMYDIA/NGC RT PCR (ARMC ONLY)
Chlamydia Tr: DETECTED — AB
N gonorrhoeae: NOT DETECTED

## 2021-11-05 LAB — URINALYSIS, COMPLETE (UACMP) WITH MICROSCOPIC
Bacteria, UA: NONE SEEN
Bilirubin Urine: NEGATIVE
Glucose, UA: NEGATIVE mg/dL
Hgb urine dipstick: NEGATIVE
Ketones, ur: NEGATIVE mg/dL
Leukocytes,Ua: NEGATIVE
Nitrite: NEGATIVE
Protein, ur: NEGATIVE mg/dL
Specific Gravity, Urine: 1.014 (ref 1.005–1.030)
pH: 8 (ref 5.0–8.0)

## 2021-11-05 LAB — POC URINE PREG, ED
Preg Test, Ur: NEGATIVE
Preg Test, Ur: NEGATIVE

## 2021-11-05 LAB — WET PREP, GENITAL
Clue Cells Wet Prep HPF POC: NONE SEEN
Sperm: NONE SEEN
Trich, Wet Prep: NONE SEEN

## 2021-11-05 MED ORDER — MELOXICAM 15 MG PO TABS: 15.0000 mg | ORAL_TABLET | Freq: Every day | ORAL | 0 refills | Status: DC

## 2021-11-05 MED ORDER — CEFTRIAXONE SODIUM 1 G IJ SOLR
500.0000 mg | Freq: Once | INTRAMUSCULAR | Status: AC
  Administered 2021-11-05: 500 mg via INTRAMUSCULAR
  Filled 2021-11-05: qty 10

## 2021-11-05 MED ORDER — DOXYCYCLINE HYCLATE 100 MG PO TABS: 100.0000 mg | ORAL_TABLET | Freq: Two times a day (BID) | ORAL | 0 refills | Status: DC

## 2021-11-05 MED ORDER — FLUCONAZOLE 150 MG PO TABS: 150.0000 mg | ORAL_TABLET | Freq: Once | ORAL | 0 refills | Status: AC

## 2021-11-05 NOTE — ED Triage Notes (Signed)
Pt to ED for left foot pain after fx 4 years ago, states increased pain recently.  Also reports yeast infection a few weeks ago and does not believe it went away.

## 2021-11-05 NOTE — ED Provider Notes (Signed)
[facility] Emergency Department Provider Note  ____________________________________________  Time seen: Approximately 5:25 PM  I have reviewed the triage vital signs and the nursing notes.   HISTORY  Chief Complaint Foot Injury    HPI Mary Gilbert is a 30 y.o. female who presents the emergency department complaining of 2 separate complaints.  Patient states that she is here as her left foot hurts.  She had an injury 4 years ago where she had an avulsion fracture off of her talus.  She states that she will have generalized foot pain.  No specific tenderness.  No injury.  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  Again no new injuries.  No reported edema.  Patient is also concerned that she may have ongoing yeast infection.  She states that she was treated several weeks ago for yeast infection but is concerned that it may not have cleared as her boyfriend is now experiencing penile discharge.  She has no discharge, bleeding, urinary changes.  No concern for pregnancy or STD.  Denies any abdominal pain.       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 Authorizing Provider  doxycycline (VIBRA-TABS) 100 MG tablet Take 1 tablet (100 mg total) by mouth 2 (two) times daily. 11/05/21  Yes Giovany Cosby, Delorise Royals, PA-C  fluconazole (DIFLUCAN) 150 MG tablet Take 1  tablet (150 mg total) by mouth once for 1 dose. Take 1 tablet after finishing antibiotics, 3 days later take second tablet 11/05/21 11/05/21 Yes Eleftheria Taborn, Delorise Royals, PA-C  meloxicam (MOBIC) 15 MG tablet Take 1 tablet (15 mg total) by mouth daily. 11/05/21  Yes Cayman Brogden, Delorise Royals, PA-C  amLODipine (NORVASC) 5 MG tablet Take 5 mg by mouth daily.    [provider]  amoxicillin (AMOXIL) 875 MG tablet Take 1 tablet (875 mg total) by mouth 2 (two) times daily. 08/02/21   Sherrie Mustache Roselyn Bering, PA-C  hydrOXYzine (ATARAX/VISTARIL) 25 MG tablet Take 1 tablet (25 mg total) by mouth every 6 (six) hours. 07/22/21   Becky Augusta, NP  predniSONE (STERAPRED UNI-PAK 21 TAB) 10 MG (21) TBPK tablet Take 6 pills on day one then decrease by 1 pill each day 08/02/21   Faythe Ghee, PA-C    Allergies Trazodone and nefazodone  Family History  Problem Relation Age of Onset   Mental illness Mother    Heart disease Father    Hypertension Father     Social History Social History   Tobacco Use   Smoking status: Every Day    Packs/day: 1.00    Types: Cigarettes   Smokeless tobacco: Never  Vaping Use   Vaping Use: Never used  Substance Use Topics   Alcohol use: No   Drug use: No     Review of Systems  Constitutional: No fever/chills Eyes: No visual changes. No discharge ENT:  No upper respiratory complaints. Cardiovascular: no chest pain. Respiratory: no cough. No SOB. Gastrointestinal: No abdominal pain.  No nausea, no vomiting.  No diarrhea.  No constipation. Genitourinary: Negative for dysuria. No hematuria.  No vaginal discharge.  No vaginal bleeding. Musculoskeletal: Left foot pain Skin: Negative for rash, abrasions, lacerations, ecchymosis. Neurological: Negative for headaches, focal weakness or numbness.  10 System ROS otherwise negative.  ____________________________________________   PHYSICAL EXAM:  VITAL SIGNS: ED Triage Vitals  Enc Vitals Group     BP 11/05/21 1708 (!) 161/118      Pulse Rate 11/05/21 1708 87     Resp 11/05/21 1708 17     Temp 11/05/21 1708 98.3 F (36.8 C)     Temp Source 11/05/21 1708 Oral     SpO2 11/05/21 1708 98 %     Weight 11/05/21 1709 240 lb (108.9 kg)     Height 11/05/21 1709 5\' 6"  (1.676 m)     Head Circumference --      Peak Flow --      Pain Score 11/05/21 1708 0     Pain Loc --      Pain Edu? --      Excl. in [place]? --      Constitutional: Alert and oriented. Well appearing and in no acute distress. Eyes: Conjunctivae are normal. PERRL. EOMI. Head: Atraumatic. ENT:      Ears:       Nose: No congestion/rhinnorhea.      Mouth/Throat: Mucous membranes are moist.  Neck: No stridor.    Cardiovascular: Normal rate, regular rhythm. Normal S1 and S2.  Good peripheral circulation. Respiratory: Normal respiratory effort without tachypnea or retractions. Lungs CTAB. Good air entry to the bases with no decreased or absent breath sounds. Gastrointestinal: Bowel sounds 4 quadrants. Soft and nontender to palpation. No guarding or rigidity. No palpable masses. No distention. No CVA tenderness. Musculoskeletal: Full range of motion to all extremities. No gross deformities appreciated.  Visualization of the left foot reveals no edema, no ecchymosis.  No open wounds.  Patient is tender in interdigital space extending in between the second and third and third and fourth metatarsals.  No palpable abnormality.  Sensation intact all digits.  Capillary refill less than 2 seconds all digits. Neurologic:  Normal speech and language. No gross focal neurologic deficits are appreciated.  Skin:  Skin is warm, dry and intact. No rash noted. Psychiatric: Mood and affect are normal. Speech and behavior are normal. Patient exhibits appropriate insight and judgement.   ____________________________________________   LABS (all labs ordered are listed, but only abnormal results are displayed)  Labs Reviewed  CHLAMYDIA/NGC RT PCR ([facility] ONLY)           -  Abnormal; Notable for the following components:      Result Value   Chlamydia Tr DETECTED (*)    All other components within normal limits  WET PREP, GENITAL - Abnormal; Notable for the following components:   Yeast Wet Prep HPF POC PRESENT (*)    WBC, Wet Prep HPF POC FEW (*)    All other components within normal limits  URINALYSIS, COMPLETE (UACMP) WITH MICROSCOPIC - Abnormal; Notable for the following components:   Color, Urine YELLOW (*)    APPearance HAZY (*)    All other components within normal limits  POC URINE PREG, ED  POC URINE PREG, ED   ____________________________________________  EKG   ____________________________________________  RADIOLOGY   No results found.  ____________________________________________  PROCEDURES  Procedure(s) performed:    Procedures    Medications  cefTRIAXone (ROCEPHIN) injection 500 mg (has no administration in time range)     ____________________________________________   INITIAL IMPRESSION / ASSESSMENT AND PLAN / ED COURSE  Pertinent labs & imaging results that were available during my care of the patient were reviewed by me and considered in my medical decision making (see chart for details).  Review of the [place] CSRS was performed in accordance of the [place] prior to dispensing any controlled drugs.           Patient's diagnosis is consistent with chlamydia, yeast vaginitis, foot pain.  Patient presents for foot pain and possible ongoing yeast infection.  Patient was positive for chlamydia, yeast.  No acute findings on the patient's exam of the foot.  Foot pain has been ongoing times several years.  Patient on meloxicam for foot pain, will be treated with Rocephin and doxycycline for her chlamydia, after finishing antibiotic she will take Diflucan for her yeast infection.  Follow-up with primary care or health department for further STD testing if desired.  No further work-up at this time..  Patient is given ED  precautions to return to the ED for any worsening or new symptoms.     ____________________________________________  FINAL CLINICAL IMPRESSION(S) / ED DIAGNOSES  Final diagnoses:  Left foot pain  Yeast vaginitis  Chlamydia      NEW MEDICATIONS STARTED DURING THIS VISIT:  ED Discharge Orders          Ordered    doxycycline (VIBRA-TABS) 100 MG tablet  2 times daily        11/05/21 2133    fluconazole (DIFLUCAN) 150 MG tablet   Once        11/05/21 2133    meloxicam (MOBIC) 15 MG tablet  Daily        11/05/21 2133                This chart was dictated using voice recognition software/Dragon. Despite best efforts to proofread, errors can occur which can change the meaning. Any change was purely unintentional.    Darletta Moll, PA-C 11/05/21 2137    Rada Hay, MD 11/06/21 [phone]

## 2021-11-05 NOTE — ED Notes (Signed)
Patient provided with swabs for self swab. Asked to provide a urine sample when able.

## 2021-11-24 ENCOUNTER — Telehealth: Payer: Self-pay | Admitting: Family Medicine

## 2021-11-24 NOTE — Telephone Encounter (Signed)
Patient tested positive for Chlamydia and wants to know if her partner needs to come in for treatment / testing as well. Please call.

## 2021-11-27 ENCOUNTER — Other Ambulatory Visit: Payer: Self-pay

## 2021-11-27 ENCOUNTER — Ambulatory Visit: Payer: Self-pay | Admitting: Family Medicine

## 2021-11-27 DIAGNOSIS — Z299 Encounter for prophylactic measures, unspecified: Secondary | ICD-10-CM

## 2021-11-27 DIAGNOSIS — Z113 Encounter for screening for infections with a predominantly sexual mode of transmission: Secondary | ICD-10-CM

## 2021-11-27 DIAGNOSIS — A749 Chlamydial infection, unspecified: Secondary | ICD-10-CM

## 2021-11-27 NOTE — Patient Instructions (Signed)
Expedited Partner Therapy:  Information Sheet for Patients and Partners               You have been offered expedited partner therapy (EPT). This information sheet contains important information and warnings you need to be aware of, so please read it carefully.   Expedited Partner Therapy (EPT) is the clinical practice of treating the sexual partners of persons who receive chlamydia, gonorrhea, or trichomoniasis diagnoses by providing medications or prescriptions to the patient. Patients then provide partners with these therapies without the health-care provider having examined the partner. In other words, EPT is a convenient, fast and private way for patients to help their sexual partners get treated.   Chlamydia and gonorrhea are bacterial infections you get from having sex with a person who is already infected. Trichomoniasis (or "trich") is a very common sexually transmitted infection (STI) that is caused by infection with a protozoan parasite called Trichomonas vaginalis.  Many people with these infections don't know it because they feel fine, but without treatment these infections can cause serious health problems, such as pelvic inflammatory disease, ectopic pregnancy, infertility and increased risk of HIV.   It is important to get treated as soon as possible to protect your health, to avoid spreading these infections to others, and to prevent yourself from becoming re-infected. The good news is these infections can be easily cured with proper antibiotic medicine. The best way to take care of your self is to see a doctor or go to your local health department. If you are not able to see a doctor or other medical provider, you should take EPT.    Recommended Medication: EPT for Chlamydia:  Doxycycline 100 mg  PO BID x 7 days  NO SEX FOR 14 DAYS !!!!!!!!   These medicines are very safe. However, you should not take them if you have ever had an allergic reaction (like a rash) to any of these  medicines: azithromycin (Zithromax), erythromycin, clarithromycin (Biaxin). If you are uncertain about whether you have an allergy, call your medical provider or pharmacist before taking this medicine. If you have a serious, long-term illness like kidney, liver or heart disease, colitis or stomach problems, or you are currently taking other prescription medication, talk to your provider before taking this medication.   Women: If you have lower belly pain, pain during sex, vomiting, or a fever, do not take this medicine. Instead, you should see a medical provider to be certain you do not have pelvic inflammatory disease (PID). PID can be serious and lead to infertility, pregnancy problems or chronic pelvic pain.   Pregnant Women: It is very important for you to see a doctor to get pregnancy services and pre-natal care. These antibiotics for EPT are safe for pregnant women, but you still need to see a medical provider as soon as possible. It is also important to note that Doxycycline is an alternative therapy for chlamydia, but it should not be taken by someone who is pregnant.   Men: If you have pain or swelling in the testicles or a fever, do not take this medicine and see a medical provider.     Men who have sex with men (MSM): MSM in [place] continue to experience high rates of syphilis and HIV. Many MSM with gonorrhea or chlamydia could also have syphilis and/or HIV and not know it. If you are a man who has sex with other men, it is very important that you see a medical provider and  are tested for HIV and syphilis. EPT is not recommended for gonorrhea for MSM.  Recommended treatment for gonorrhea for MSM is Rocephin (shot) AND azithromycin due to decreased cure rate.  Please see your medical provider if this is the case.    Along with this information sheet is a prescription for the medicine. If you receive a prescription it will be in your name and will indicate your date of birth, or it will be  in the name of "Expedited Partner Therapy".   In either case, you can have the prescription filled at a pharmacy. You will be responsible for the cost of the medicine, unless you have prescription drug coverage. In that case, you could provide your name so the pharmacy could bill your health plan.   Take the medication as directed. Some people will have a mild, upset stomach, which does not last long. AVOID alcohol 24 hours after taking metronidazole (Flagyl) to reduce the possibility of a disulfiram-like reaction (severe vomiting and abdominal pain).  After taking the medicine, do not have sex for 7 days. Do not share this medicine or give it to anyone else. It is important to tell everyone you have had sex with in the last 60 days that they need to go and get tested for sexually transmitted infections.   Ways to prevent these and other sexually transmitted infections (STIs):    Abstain from sex. This is the only sure way to avoid getting an STI.   Use barrier methods, such as condoms, consistently and correctly.   Limit the number of sexual partners.   Have regular physical exams, including testing for STIs.   For more information about EPT or other issues pertaining to an STI, please contact your medical provider or the [facility] Department at [phone]

## 2021-11-30 MED ORDER — DOXYCYCLINE HYCLATE 100 MG PO TABS: 100.0000 mg | ORAL_TABLET | Freq: Two times a day (BID) | ORAL | 0 refills | Status: AC

## 2021-11-30 NOTE — Progress Notes (Signed)
[facility] Department  STI clinic/screening visit [address] [place] [address] [phone]  Subjective:  Mary Gilbert is a 30 y.o. female being seen today for an STI screening visit. The patient reports they do have symptoms.  Patient reports that they do not desire a pregnancy in the next year.   They reported they are not interested in discussing contraception today.    Patient's last menstrual period was 11/05/2021.   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  Pt was seen at the ER and was treated for gonorrhea and RX was given for treatment for chlamydia.  Pt went to [facility] to fill rx and gave medication to partner.    Last HIV test per patient/review of record was 2021 Patient reports last pap was 07/2019.   Screening for MPX risk: Does the patient have an unexplained rash? No Is the patient MSM? No Does the patient endorse multiple sex partners or anonymous sex partners? No Did the patient have close or sexual contact with a person diagnosed with MPX? No Has the patient traveled outside the [place] where MPX is endemic? No Is there a high clinical suspicion for MPX-- evidenced by one of the following No  -Unlikely to be chickenpox  -Lymphadenopathy  -Rash that present in same phase of evolution on any given body part See flowsheet for further details and programmatic requirements.    The following portions of the patient's history were reviewed and updated as appropriate:  allergies, current medications, past medical history, past social history, past surgical history and problem list.  Objective:  There were no vitals filed for this visit.  Physical Exam Vitals and nursing note reviewed.  Constitutional:      Appearance: Normal appearance. She is obese.  HENT:     Head: Normocephalic and atraumatic.     Mouth/Throat:     Mouth: Mucous membranes are moist.     Pharynx: Oropharynx is clear. No oropharyngeal exudate or posterior oropharyngeal erythema.  Pulmonary:     Effort: Pulmonary effort is normal.  Abdominal:     General: Abdomen is flat.     Palpations: There is no mass.     Tenderness: There is no abdominal tenderness. There is no rebound.  Genitourinary:    Exam position: Lithotomy position.     Pubic Area: No rash or pubic lice.      Labia:        Right: No rash or lesion.        Left: No rash or lesion.      Vagina: Normal. No vaginal discharge, erythema, bleeding or lesions.     Cervix: No cervical motion tenderness, discharge, friability, lesion or erythema.     Uterus: Normal.      Adnexa: Right adnexa normal and left adnexa normal.     Comments: Deferred  Musculoskeletal:     Cervical back: Normal range of motion and neck supple.  Lymphadenopathy:     Head:     Right side of head: No preauricular  or posterior auricular adenopathy.     Left side of head: No preauricular or posterior auricular adenopathy.     Cervical: No cervical adenopathy.     Upper Body:     Right upper body: No supraclavicular or axillary adenopathy.     Left upper body: No supraclavicular or axillary adenopathy.     Lower Body: No right inguinal adenopathy. No left inguinal adenopathy.  Skin:    General: Skin is warm and dry.     Findings: No rash.  Neurological:     Mental Status: She is alert and oriented to person, place, and time.     Assessment and Plan:  Mary Gilbert is a 30 y.o. female presenting to the [facility] Department for  STI screening  1. Screening examination for venereal disease Pt did not take medication for chlamydia as prescribed from [facility] ER.  Patient gave medication to partner but has has sex with partner and she is untreated.   Pt tested on 11/05/2021  Discussed correct method to take medication.  No sex for next 14 days and importance of taking all of medication.    2. Prophylactic measure Expedited partner therapy   - doxycycline (VIBRA-TABS) 100 MG tablet; Take 1 tablet (100 mg total) by mouth 2 (two) times daily for 7 days. Expedited partner therapy  Dispense: 14 tablet; Refill: 0  3. Chlamydia  - doxycycline (VIBRA-TABS) 100 MG tablet; Take 1 tablet (100 mg total) by mouth 2 (two) times daily for 7 days.  Dispense: 14 tablet; Refill: 0  Pt to have TOC in 2-3 months.   Pt verbalizes understanding of importance of treatment.    No follow-ups on file.  No future appointments.  Wendi Snipes, FNP

## 2021-12-07 NOTE — Progress Notes (Signed)
Chart reviewed by Pharmacist  Suzanne Walker PharmD, Contract Pharmacist at [facility]  

## 2022-03-22 ENCOUNTER — Ambulatory Visit
Admission: EM | Admit: 2022-03-22 | Discharge: 2022-03-22 | Disposition: A | Discharge status: Home / Self Care | Payer: Self-pay

## 2022-03-22 ENCOUNTER — Other Ambulatory Visit: Payer: Self-pay

## 2022-03-22 ENCOUNTER — Telehealth: Payer: Self-pay

## 2022-03-22 DIAGNOSIS — L02211 Cutaneous abscess of abdominal wall: Secondary | ICD-10-CM

## 2022-03-22 MED ORDER — SULFAMETHOXAZOLE-TRIMETHOPRIM 800-160 MG PO TABS: 1.0000 | ORAL_TABLET | Freq: Two times a day (BID) | ORAL | 0 refills | Status: DC

## 2022-03-22 MED ORDER — SULFAMETHOXAZOLE-TRIMETHOPRIM 800-160 MG PO TABS: 1.0000 | ORAL_TABLET | Freq: Two times a day (BID) | ORAL | 0 refills | Status: AC

## 2022-03-22 NOTE — ED Provider Notes (Signed)
?Provider Note ? ?Patient Contact: 10:31 AM (approximate) ? ? ?History  ? ?Skin Problem ? ? ?HPI ? ?Mary Gilbert is a 31 y.o. female presents to the emergency department with a 3 cm x 2 cm right upper abdominal wall abscess.  Patient has been symptomatic for the past 2 to 3 days.  She has tried a self expression at home with little relief.  No fever or chills. ? ?  ? ? ?Physical Exam  ? ?Triage Vital Signs: ?ED Triage Vitals  ?Enc Vitals Group  ?   BP 03/22/22 0927 (!) 147/94  ?   Pulse Rate 03/22/22 0927 83  ?   Resp 03/22/22 0927 18  ?   Temp 03/22/22 0927 98.3 ?F (36.8 ?C)  ?   Temp Source 03/22/22 0927 Oral  ?   SpO2 03/22/22 0927 98 %  ?   Weight --   ?   Height --   ?   Head Circumference --   ?   Peak Flow --   ?   Pain Score 03/22/22 0929 8  ?   Pain Loc --   ?   Pain Edu? --   ?   Excl. in [place]? --   ? ? ?Most recent vital signs: ?Vitals:  ? 03/22/22 0927  ?BP: (!) 147/94  ?Pulse: 83  ?Resp: 18  ?Temp: 98.3 ?F (36.8 ?C)  ?SpO2: 98%  ? ? ? ?General: Alert and in no acute distress. ?Cardiovascular:  Good peripheral perfusion ?Respiratory: Normal respiratory effort without tachypnea or retractions. Lungs CTAB. Good air entry to the bases with no decreased or absent breath sounds. ?Gastrointestinal: Bowel sounds ?4 quadrants. Soft and nontender to palpation. No guarding or rigidity. No palpable masses. No distention. No CVA tenderness. ?Musculoskeletal: Full range of motion to all extremities.  ?Neurologic:  No gross focal neurologic deficits are appreciated.  ?Skin: Patient has a 3 cm x 2 cm fluctuant and indurated right upper abdominal abscess. ? ? ? ?ED Results / Procedures / Treatments  ? ?Labs ?(all labs ordered are listed, but only abnormal results are displayed) ?Labs Reviewed - No data to display ? ? ? ? ? ? ? ? ?PROCEDURES: ? ?Critical Care performed: No ? ?Incision and Drainage ? ?Date/Time: 03/22/2022 10:33 AM ?Performed by: Vallarie Mare M, PA-C ?Authorized by: Vallarie Mare M, PA-C  ? ?Consent:  ?   Consent obtained:  Verbal ?  Consent given by:  Patient ?  Risks discussed:  Bleeding, infection, incomplete drainage and pain ?  Alternatives discussed:  Alternative treatment, delayed treatment and observation ?Location:  ?  Type:  Abscess ?Anesthesia:  ?  Anesthesia method:  Local infiltration ?  Local anesthetic:  Lidocaine 1% w/o epi ?Procedure type:  ?  Complexity:  Simple ?Procedure details:  ?  Drainage:  Purulent ?  Drainage amount:  Copious ?  Packing materials:  None ?Post-procedure details:  ?  Procedure completion:  Tolerated well, no immediate complications ? ? ?MEDICATIONS ORDERED IN ED: ?Medications - No data to display ? ? ?IMPRESSION / MDM / ASSESSMENT AND PLAN / ED COURSE  ?I reviewed the triage vital signs and the nursing notes. ?             ?               ?Assessment and plan ?Abscess ?Differential diagnosis includes, but is not limited to, cellulitis versus abscess ?31 year old female with history of folliculitis presents to the urgent care with a 3  cm x 2 cm right upper chest wall abscess.  Patient underwent incision and drainage without complication.  She was started on Bactrim.  She denies possibility of pregnancy.  Return precautions were given to return with new or worsening symptoms. ? ?  ? ? ?FINAL CLINICAL IMPRESSION(S) / ED DIAGNOSES  ? ?Final diagnoses:  ?Abdominal wall abscess  ? ? ? ?Rx / DC Orders  ? ?ED Discharge Orders   ? ?      Ordered  ?  sulfamethoxazole-trimethoprim (BACTRIM DS) 800-160 MG tablet  2 times daily       ? 03/22/22 1029  ? ?  ?  ? ?  ? ? ? ?Note:  This document was prepared using Dragon voice recognition software and may include unintentional dictation errors. ?  ?Lannie Fields, PA-C ?03/22/22 1034 ? ?

## 2022-03-22 NOTE — Telephone Encounter (Signed)
Patient called to have bactrim prescription sent to [facility] in [place]. She states the [facility] in [place] is closed today.  ?

## 2022-03-22 NOTE — ED Triage Notes (Signed)
Pt present an abscess on the right side of her abdomen area. The area is red and draining.  ?

## 2022-03-22 NOTE — Discharge Instructions (Signed)
Take Bactrim twice daily for seven days.  

## 2023-01-20 ENCOUNTER — Ambulatory Visit: Payer: Self-pay | Admitting: Advanced Practice Midwife

## 2023-01-20 DIAGNOSIS — Z113 Encounter for screening for infections with a predominantly sexual mode of transmission: Secondary | ICD-10-CM

## 2023-01-20 DIAGNOSIS — A599 Trichomoniasis, unspecified: Secondary | ICD-10-CM

## 2023-01-20 DIAGNOSIS — F17200 Nicotine dependence, unspecified, uncomplicated: Secondary | ICD-10-CM

## 2023-01-20 DIAGNOSIS — F325 Major depressive disorder, single episode, in full remission: Secondary | ICD-10-CM

## 2023-01-20 DIAGNOSIS — E282 Polycystic ovarian syndrome: Secondary | ICD-10-CM

## 2023-01-20 LAB — WET PREP FOR TRICH, YEAST, CLUE
Trichomonas Exam: POSITIVE — AB
Yeast Exam: NEGATIVE

## 2023-01-20 LAB — HM HEPATITIS C SCREENING LAB: HM Hepatitis Screen: NEGATIVE

## 2023-01-20 LAB — HM HIV SCREENING LAB: HM HIV Screening: NEGATIVE

## 2023-01-20 NOTE — Progress Notes (Addendum)
Wet prep reviewed, treated for trich. Trich pamphlet given to patient.  Provided counseling on medication, the side effects. Patient given the opportunity to ask questions. Questions were answered. Pt advised to RTC in 3 months for TOC. Patient counseled to use condoms with all sex, condoms were given at this visit.   Harland Dingwall, RN

## 2023-01-20 NOTE — Progress Notes (Signed)
[facility] Department  STI clinic/screening visit [place] [address] [phone]  Subjective:  Wanda Rideout is a 32 y.o. SWF G3P0 smoker female being seen today for an STI screening visit. The patient reports they do have symptoms.  Patient reports that they do not desire a pregnancy in the next year.   They reported they are not interested in discussing contraception today.    No LMP recorded.  Patient has the following medical conditions:   Patient Active Problem List   Diagnosis Date Noted   PCOS (polycystic ovarian syndrome) 01/20/2023   Morbid obesity ([place]) 01/20/2023   Depression, major, single episode, complete remission [facility]) dx'd 2021 01/20/2023    Chief Complaint  Patient presents with   STI Screening    HPI  Patient reports c/o external itching, vaginal irritation x couple weeks. Douched right before symptoms occurred. Is on diet pills. PCOS and depression dx'd 3 years ago. Current cig smoker. Last cigar 10 years ago. Last MJ 32 yo. Last ETOH 32 yo. Last pap 3 years ago and abnormal.  Does the patient using douching products? Yes  Last HIV test per patient/review of record was No results found for: "HMHIVSCREEN" No results found for: "HIV" Patient reports last pap was No results found for: "DIAGPAP" pt states last pap was 3 years ago and abnormal since age 37  Screening for MPX risk: Does the patient have an unexplained rash? No Is the patient MSM? No Does the patient endorse multiple sex partners or anonymous sex partners? No Did the patient have close or sexual contact with a person diagnosed with MPX? No Has the patient traveled outside the [place] where MPX is endemic? No Is there a high clinical suspicion for MPX-- evidenced by one of the following No  -Unlikely to be chickenpox  -Lymphadenopathy  -Rash that present in same phase of evolution on any given body part See flowsheet for further details and programmatic  requirements.   Immunization history:   There is no immunization history on file for this patient.   The following portions of the patient's history were reviewed and updated as appropriate: allergies, current medications, past medical history, past social history, past surgical history and problem list.  Objective:  There were no vitals filed for this visit.  Physical Exam Vitals and nursing note reviewed.  Constitutional:      Appearance: Normal appearance. She is obese.  HENT:     Head: Normocephalic and atraumatic.     Mouth/Throat:     Mouth: Mucous membranes are moist.     Pharynx: Oropharynx is clear. No oropharyngeal exudate or posterior oropharyngeal erythema.  Eyes:     Conjunctiva/sclera: Conjunctivae normal.  Pulmonary:     Effort: Pulmonary effort is normal.  Abdominal:     Palpations: Abdomen is soft. There is no mass.     Tenderness: There is no abdominal tenderness. There is no rebound.     Comments: Soft without masses or tenderness, fair tone  Genitourinary:    General: Normal vulva.     Exam position: Lithotomy position.     Pubic Area: No rash or pubic lice.      Labia:        Right: No rash or lesion.        Left: No rash or lesion.      Vagina: Vaginal discharge (white creamy leukorrhea, ph<4.5) present. No erythema, bleeding or lesions.     Cervix: Normal.  Uterus: Normal.      Rectum: Normal.     Comments: pH = <4.5 Lymphadenopathy:     Head:     Right side of head: No preauricular or posterior auricular adenopathy.     Left side of head: No preauricular or posterior auricular adenopathy.     Cervical: No cervical adenopathy.     Right cervical: No superficial, deep or posterior cervical adenopathy.    Left cervical: No superficial, deep or posterior cervical adenopathy.     Upper Body:     Right upper body: No supraclavicular, axillary or epitrochlear adenopathy.     Left upper body: No supraclavicular, axillary or epitrochlear adenopathy.      Lower Body: No right inguinal adenopathy. No left inguinal adenopathy.  Skin:    General: Skin is warm and dry.     Findings: No rash.  Neurological:     Mental Status: She is alert and oriented to person, place, and time.      Assessment and Plan:  Nylah Butkus is a 32 y.o. female presenting to the [facility] Department for STI screening  1. Screening examination for venereal disease Treat wet mount per standing orders Immunization nurse consult  - Gonococcus culture - Chlamydia/Gonorrhea [place] [facility] - Syphilis Serology, [place] [facility] - HIV/HCV [place], YEAST, CLUE  2. PCOS (polycystic ovarian syndrome)   3. Morbid obesity ([place])   4. Depression, major, single episode, complete remission ([place]) dx'd 2021    Patient accepted all screenings including oral, vaginal CT/GC and bloodwork for HIV/RPR, and wet prep. Patient meets criteria for HepB screening? Yes. Ordered? no Patient meets criteria for HepC screening? Yes. Ordered? yes  Treat wet prep per standing order Discussed time line for [facility] results and that patient will be called with positive results and encouraged patient to call if she had not heard in 2 weeks.  Counseled to return or seek care for continued or worsening symptoms Recommended condom use with all sex  Patient is currently using  nothing  to prevent pregnancy.    No follow-ups on file.  No future appointments.  Herbie Saxon, CNM

## 2023-01-24 LAB — GONOCOCCUS CULTURE

## 2023-01-26 MED ORDER — METRONIDAZOLE 500 MG PO TABS: 500.0000 mg | ORAL_TABLET | Freq: Two times a day (BID) | ORAL | 0 refills | Status: AC

## 2023-01-26 MED ORDER — METRONIDAZOLE 500 MG PO TABS: 500.0000 mg | ORAL_TABLET | Freq: Two times a day (BID) | ORAL | 0 refills | Status: DC

## 2023-01-26 NOTE — Progress Notes (Signed)
Addendum, added metronidazole to medication list, dispensed 01/20/23 to patient while in clinic.  Harland Dingwall, RN

## 2023-01-26 NOTE — Addendum Note (Signed)
Addended by: Harland Dingwall on: 01/26/2023 08:35 AM   Modules accepted: Orders

## 2023-02-20 ENCOUNTER — Telehealth: Payer: Self-pay | Admitting: Nurse Practitioner

## 2023-02-20 DIAGNOSIS — N898 Other specified noninflammatory disorders of vagina: Secondary | ICD-10-CM

## 2023-02-20 NOTE — Progress Notes (Signed)
Virtual Visit Consent   Mary Gilbert, you are scheduled for a virtual visit with Mary-Margaret Hassell Done, [place], a [facility] provider, today.     Just as with appointments in the office, your consent must be obtained to participate.  Your consent will be active for this visit and any virtual visit you may have with one of our providers in the next 365 days.     If you have a MyChart account, a copy of this consent can be sent to you electronically.  All virtual visits are billed to your insurance company just like a traditional visit in the office.    As this is a virtual visit, video technology does not allow for your provider to perform a traditional examination.  This may limit your provider's ability to fully assess your condition.  If your provider identifies any concerns that need to be evaluated in person or the need to arrange testing (such as labs, EKG, etc.), we will make arrangements to do so.     Although advances in technology are sophisticated, we cannot ensure that it will always work on either your end or our end.  If the connection with a video visit is poor, the visit may have to be switched to a telephone visit.  With either a video or telephone visit, we are not always able to ensure that we have a secure connection.     I need to obtain your verbal consent now.   Are you willing to proceed with your visit today? YES   Mary Gilbert has provided verbal consent on 02/20/2023 for a virtual visit (video or telephone).   Mary-Margaret Hassell Done, FNP   Date: 02/20/2023 12:50 PM   Virtual Visit via Video Note   I, Mary-Margaret Hassell Done, connected with Mary Gilbert ([id number], 10-30-1991) on 02/20/23 at  1:15 PM EST by a video-enabled telemedicine application and verified that I am speaking with the correct person using two identifiers.  Location: Patient: Virtual Visit Location Patient: Mobile Provider: Virtual Visit Location Provider: Mobile   I discussed the  limitations of evaluation and management by telemedicine and the availability of in person appointments. The patient expressed understanding and agreed to proceed.    History of Present Illness: Mary Gilbert is a 32 y.o. who identifies as a female who was assigned female at birth, and is being seen today for STD.  HPI: Patient was treated for trich a month ago. SHe does not think it has completely resolved- still has itchy discharge that is yellowish greenish tint and has foul odor.. The symptoms are the exact same as they were before. She does have a new sex partner.    Review of Systems  Constitutional:  Negative for diaphoresis and weight loss.  Eyes:  Negative for blurred vision, double vision and pain.  Respiratory:  Negative for shortness of breath.   Cardiovascular:  Negative for chest pain, palpitations, orthopnea and leg swelling.  Gastrointestinal:  Negative for abdominal pain.  Genitourinary: Negative.   Skin:  Negative for rash.  Neurological:  Negative for dizziness, sensory change, loss of consciousness, weakness and headaches.  Endo/Heme/Allergies:  Negative for polydipsia. Does not bruise/bleed easily.  Psychiatric/Behavioral:  Negative for memory loss. The patient does not have insomnia.   All other systems reviewed and are negative.   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   Chronic hypertension affecting pregnancy 08/22/2019   Obesity affecting pregnancy in first trimester 08/22/2019   Dizygotic twins 08/22/2019   Supervision of high risk pregnancy, antepartum 08/22/2019    Allergies:  Allergies  Allergen Reactions   Nefazodone     Other reaction(s): Other (See Comments) Headache,  abdominal pain   Trazodone And Nefazodone     Headache, abdominal pain   Medications:  Current Outpatient Medications:    amLODipine (NORVASC) 5 MG tablet, Take 5 mg by mouth daily., Disp: , Rfl:    fluconazole (DIFLUCAN) 150 MG tablet, Take by mouth., Disp: , Rfl:    meloxicam (MOBIC) 15 MG tablet, Take 1 tablet (15 mg total) by mouth daily. (Patient not taking: Reported on 11/27/2021), Disp: 30 tablet, Rfl: 0   Multiple Vitamin (MULTIVITAMIN PO), Take 1 tablet by mouth daily., Disp: , Rfl:    phentermine (ADIPEX-P) 37.5 MG tablet, Take 37.5 mg by mouth daily before breakfast., Disp: , Rfl:   Observations/Objective: Patient is well-developed, well-nourished in no acute distress.  Resting comfortably  at home.  Head is normocephalic, atraumatic.  No labored breathing.  Speech is clear and coherent with logical content.  Patient is alert and oriented at baseline.    Assessment and Plan:  Mary Gilbert in today with chief complaint of Exposure to STD   1. Vaginal discharge Needs to have a face to face visit to make sure treating correctly   Follow Up Instructions: I discussed the assessment and treatment plan with the patient. The patient was provided an opportunity to ask questions and all were answered. The patient agreed with the plan and demonstrated an understanding of the instructions.  A copy of instructions were sent to the patient via MyChart.  The patient was advised to call back or seek an in-person evaluation if the symptoms worsen or if the condition fails to improve as anticipated.  Time:  I spent 5 minutes with the patient via telehealth technology discussing the above problems/concerns.    Mary-Margaret Hassell Done, FNP

## 2023-02-20 NOTE — Patient Instructions (Signed)
  Gerald Dexter, thank you for joining Chevis Pretty, FNP for today's virtual visit.  While this provider is not your primary care provider (PCP), if your PCP is located in our provider database this encounter information will be shared with them immediately following your visit.   [facility] account gives you access to today's visit and all your visits, tests, and labs performed at [facility] " click here if you don't have a [facility] account or go to [email or web address]  Consent: (Patient) Mary Gilbert provided verbal consent for this virtual visit at the beginning of the encounter.  Current Medications:  Current Outpatient Medications:    amLODipine (NORVASC) 5 MG tablet, Take 5 mg by mouth daily., Disp: , Rfl:    fluconazole (DIFLUCAN) 150 MG tablet, Take by mouth., Disp: , Rfl:    meloxicam (MOBIC) 15 MG tablet, Take 1 tablet (15 mg total) by mouth daily. (Patient not taking: Reported on 11/27/2021), Disp: 30 tablet, Rfl: 0   Multiple Vitamin (MULTIVITAMIN PO), Take 1 tablet by mouth daily., Disp: , Rfl:    phentermine (ADIPEX-P) 37.5 MG tablet, Take 37.5 mg by mouth daily before breakfast., Disp: , Rfl:    Medications ordered in this encounter:  No orders of the defined types were placed in this encounter.    *If you need refills on other medications prior to your next appointment, please contact your pharmacy*  Follow-Up: Call back or seek an in-person evaluation if the symptoms worsen or if the condition fails to improve as anticipated.  [place] [phone]  Other Instructions Safe sex encouraged   If you have been instructed to have an in-person evaluation today at a local Urgent Care facility, please use the link below. It will take you to a list of all of our available [facility], including address, phone number and hours of operation. Please do not delay care.  [facility]  If you or a family member do not have a primary care provider, use the link below to schedule a visit and establish care. When you choose a [facility] primary care physician or advanced practice provider, you gain a long-term partner in health. Find a Primary Care Provider  Learn more about [facility]'s in-office and virtual care options: [facility]

## 2023-02-23 ENCOUNTER — Encounter: Payer: Self-pay | Admitting: Family

## 2023-02-23 ENCOUNTER — Ambulatory Visit: Payer: Self-pay | Admitting: Family

## 2023-02-23 DIAGNOSIS — A599 Trichomoniasis, unspecified: Secondary | ICD-10-CM

## 2023-02-23 DIAGNOSIS — B9689 Other specified bacterial agents as the cause of diseases classified elsewhere: Secondary | ICD-10-CM

## 2023-02-23 DIAGNOSIS — Z113 Encounter for screening for infections with a predominantly sexual mode of transmission: Secondary | ICD-10-CM

## 2023-02-23 LAB — WET PREP FOR TRICH, YEAST, CLUE
Trichomonas Exam: POSITIVE — AB
Yeast Exam: NEGATIVE

## 2023-02-23 MED ORDER — METRONIDAZOLE 500 MG PO TABS: 500.0000 mg | ORAL_TABLET | Freq: Two times a day (BID) | ORAL | 0 refills | Status: AC

## 2023-02-23 NOTE — Progress Notes (Signed)
[facility] Department  STI clinic/screening visit [place] [address] [phone]  Subjective:  Mary Gilbert is a 32 y.o. female being seen today for an STI screening visit. The Mary Gilbert reports they do have symptoms.  Mary Gilbert reports that they do not desire a pregnancy in the next year.   They reported they are not interested in discussing contraception today.    Mary Gilbert's last menstrual period was 02/13/2023 (exact date).  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  Yellowish discharge, odor, itching    HPI  Mary Gilbert reports watery foul smelling vaginal discharge like in January 2024 when Mary Gilbert was diagnosed with trichomoniasis. Mary Gilbert took Mary Gilbert medication Mary was told by Mary Gilbert partner that he was tested Mary treated as well. States that Mary Gilbert symptoms had resolved for a short Gilbert but then recurred within the last month.  Does the Mary Gilbert using douching products? No  Last HIV test per Mary Gilbert/review of record was  Lab Results  Component Value Date   HMHIVSCREEN Negative - Validated 01/20/2023    Lab Results  Component Value Date   HIV Non Reactive 01/04/2020   Mary Gilbert reports last pap was  Lab Results  Component Value Date   DIAGPAP (A) 08/22/2019    LOW GRADE SQUAMOUS INTRAEPITHELIAL LESION:  CIN-1/ HPV (LSIL).   No results found for: "SPECADGYN"  Screening for MPX risk: Does the Mary Gilbert have an unexplained rash? No Is the Mary Gilbert MSM? No Does the Mary Gilbert endorse multiple sex partners or anonymous sex partners? No Did the Mary Gilbert have close or sexual contact with a Gilbert diagnosed with MPX? No Has the Mary Gilbert traveled outside the [place] where MPX is endemic? No Is there a high clinical suspicion for MPX-- evidenced by one of the following No  -Unlikely to be chickenpox  -Lymphadenopathy  -Rash that present in same phase of evolution on any given body part See flowsheet for further details Mary programmatic requirements.   Immunization history:   There is no immunization history on file for this Mary Gilbert.   The following portions of the Mary Gilbert's history were reviewed Mary updated as appropriate: allergies, current medications, past medical history, past social history, past surgical history Mary problem list.  Objective:  There were no vitals filed for this visit.  Physical Exam Vitals Mary nursing note reviewed.  Constitutional:      Appearance: Normal appearance.  HENT:     Head: Normocephalic Mary atraumatic.     Mouth/Throat:     Mouth: Mucous membranes are moist.     Pharynx: Oropharynx is clear. No oropharyngeal exudate or posterior oropharyngeal erythema.  Pulmonary:     Effort: Pulmonary effort is normal.  Abdominal:     General: Abdomen is flat.     Palpations: There is no mass.     Tenderness: There is no abdominal tenderness. There is no rebound.  Genitourinary:  Exam position: Lithotomy position.     Pubic Area: Rash (erythematous labia, perineum, Mary buttocks folds) present. No pubic lice.      Labia:        Right: Rash present. No lesion.        Left: Rash present. No lesion.      Vagina: Vaginal discharge (copious amount yellow homogenous malodorous discharge) present. No erythema, bleeding or lesions.     Cervix: Discharge Mary erythema present. No  cervical motion tenderness, friability or lesion.     Uterus: Normal.      Adnexa: Right adnexa normal Mary left adnexa normal.     Comments: pH = >4.5 Lymphadenopathy:     Head:     Right side of head: No preauricular or posterior auricular adenopathy.     Left side of head: No preauricular or posterior auricular adenopathy.     Cervical: No cervical adenopathy.     Upper Body:     Right upper body: No supraclavicular, axillary or epitrochlear adenopathy.     Left upper body: No supraclavicular, axillary or epitrochlear adenopathy.     Lower Body: No right inguinal adenopathy. No left inguinal adenopathy.  Skin:    General: Skin is warm Mary dry.     Findings: No rash.  Neurological:     Mental Status: Mary Gilbert, Mary Gilbert, Mary Gilbert.      Assessment Mary Plan:  Mary Gilbert is a 32 y.o. female presenting to the [facility] for STI screening  1. Screening for venereal disease Will contact with positive results Use condoms for all sex  - WET PREP FOR TRICH, YEAST, CLUE - Chlamydia/Gonorrhea [facility] - metroNIDAZOLE (FLAGYL) 500 MG tablet; Take 1 tablet (500 mg total) by mouth 2 (two) times daily for 7 days.  Dispense: 14 tablet; Refill: 0  2. Trichomoniasis Metronidazole '500mg'$  PO BID x 7 days ETOH precautions Abstain from sex for 10-14 days Partner notification advised  3. Bacterial vaginosis Metronidazole therapy as above   Mary Gilbert accepted all screenings including vaginal CT/GC Mary wet prep. Mary Gilbert meets criteria for HepB screening? No. Ordered? no Mary Gilbert meets criteria for HepC screening? No. Ordered? no  Treat wet prep per standing order Discussed [facility] line for [facility] results Mary that Mary Gilbert will be called with positive results Mary encouraged Mary Gilbert to call if Mary Gilbert had not heard in 2 weeks.  Counseled to return or seek care for continued or worsening symptoms Recommended repeat testing in 3 months with positive  results. Recommended condom use with all sex  Mary Gilbert is currently using condoms to prevent pregnancy.    Return if symptoms worsen or fail to improve.  No future appointments.  Marline Backbone, FNP

## 2023-02-23 NOTE — Progress Notes (Signed)
Pt here for STI screening.  Wet mount results reviewed with patient.  Results positive for [facility] and BV.  Metronidazole '500mg'$  #14 1 tablet BID x 7 days dispensed.  Counseled re medication, side effects, plan of care and when to contact clinic for questions or concerns.  Verbalizes understanding.  Contact card x 1.  Condoms provided.-Forest Becker, RN

## 2023-03-02 ENCOUNTER — Ambulatory Visit: Payer: Self-pay

## 2023-04-13 ENCOUNTER — Emergency Department
Admission: EM | Admit: 2023-04-13 | Discharge: 2023-04-13 | Disposition: A | Discharge status: Home / Self Care | Payer: Self-pay | Attending: Emergency Medicine | Admitting: Emergency Medicine

## 2023-04-13 ENCOUNTER — Emergency Department: Payer: Self-pay

## 2023-04-13 ENCOUNTER — Other Ambulatory Visit: Payer: Self-pay

## 2023-04-13 DIAGNOSIS — M722 Plantar fascial fibromatosis: Secondary | ICD-10-CM

## 2023-04-13 DIAGNOSIS — F17200 Nicotine dependence, unspecified, uncomplicated: Secondary | ICD-10-CM | POA: Insufficient documentation

## 2023-04-13 NOTE — ED Provider Notes (Signed)
[facility] Provider Note    Event Date/Time   First MD Initiated Contact with Patient 04/13/23 1715     (approximate)   History   Foot Pain   HPI  Mary Gilbert is a 32 y.o. female with a past medical history of obesity who presents today for evaluation of pain to the bottom of her right foot.  She reports that the pain is worse when she first wakes up in the morning, and gradually gets better throughout the day but has not resolved.  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  She has not had any injuries.  She has not noticed any swelling or changes to the skin color.  No fevers or chills.  She is still able to ambulate.  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      Excl. in GC?     Most recent vital signs: Vitals:   04/13/23 1502  BP: (!) 165/97  Pulse: 86  Resp: 16  Temp: 98.2 F (36.8 C)  SpO2: 98%    Physical Exam Vitals and nursing note reviewed.  Constitutional:      General: Awake and alert. No acute distress.    Appearance: Normal appearance. The patient is obese.  HENT:     Head: Normocephalic and  atraumatic.     Mouth: Mucous membranes are moist.  Eyes:     General: PERRL. Normal EOMs        Right eye: No discharge.        Left eye: No discharge.     Conjunctiva/sclera: Conjunctivae normal.  Cardiovascular:     Rate and Rhythm: Normal rate and regular rhythm.     Pulses: Normal pulses.  Pulmonary:     Effort: Pulmonary effort is normal. No respiratory distress.     Breath sounds: Normal breath sounds.  Abdominal:     Abdomen is soft. There is no abdominal tenderness. No rebound or guarding. No distention. Musculoskeletal:        General: No swelling. Normal range of motion.     Cervical back: Normal range of motion and neck supple.  Right foot: No obvious swelling, deformity, ecchymosis, or erythema.  2+ pedal pulse.  Normal plantarflexion and dorsiflexion against resistance.  Tenderness to palpation along the plantar fascia and at the calcaneal insertion site.  Negative  squeeze test of the calcaneus.  Sensation intact to light touch throughout. Skin:    General: Skin is warm and dry.     Capillary Refill: Capillary refill takes less than 2 seconds.     Findings: No rash.  Neurological:     Mental Status: The patient is awake and alert.      ED Results / Procedures / Treatments   Labs (all labs ordered are listed, but only abnormal results are displayed) Labs Reviewed - No data to display   EKG     RADIOLOGY I independently reviewed and interpreted imaging and agree with radiologists findings.     PROCEDURES:  Critical Care performed:   Procedures   MEDICATIONS ORDERED IN ED: Medications - No data to display   IMPRESSION / MDM / ASSESSMENT AND PLAN / ED COURSE  I reviewed the triage vital signs and the nursing notes.   Differential diagnosis includes, but is not limited to, plantar fasciitis, spur, less likely fracture or dislocation.  Patient is awake and alert, hemodynamically stable and afebrile.  She is neurovascularly intact.  There is no  obvious deformity, ecchymosis, erythema, or wounds noted to her foot.  There is no swelling.  She has 2+ pedal pulses, normal capillary refill.  She has no tenderness palpation to her medial or lateral malleolus.  There is no tenderness to the dorsum of her foot.  There is negative squeeze test of her calcaneus.  X-ray obtained in triage reveals calcaneal and Achilles spurs, though no acute osseous injury.  She has tenderness along her plantar fascia, with the point of maximal tenderness at the plantar fascial insertion site into her calcaneus.  Symptoms of plantar pain, worse in the morning, consistent with plantar fasciitis.  We discussed the importance of good footwear, gentle stretching, ice, rest, and follow-up with the podiatrist for further management.  Patient understands and agrees with plan.  She was given the appropriate follow-up information.  She was discharged in stable condition.   Patient's presentation is most consistent with acute complicated illness / injury requiring diagnostic workup.      FINAL CLINICAL IMPRESSION(S) / ED DIAGNOSES   Final diagnoses:  Plantar fasciitis of right foot     Rx / DC Orders   ED Discharge Orders     None        Note:  This document was prepared using Dragon voice recognition software and may include unintentional dictation errors.   Keturah Shavers 04/13/23 1757    Georga Hacking, MD 04/14/23 2029

## 2023-04-13 NOTE — ED Triage Notes (Signed)
Pt to ED for right heel pain for past few days. Denies injuries. Ambulatory to triage, NAD noted.

## 2023-04-13 NOTE — Discharge Instructions (Signed)
It is very important that you wear very supportive shoes.  Please rest, ice, elevate your foot.  You may continue to take Tylenol/ibuprofen per package instructions to help with your symptoms.  Please follow-up with the podiatrist.  It was a pleasure caring for you today.

## 2023-11-10 ENCOUNTER — Emergency Department: Payer: Medicaid Other

## 2023-11-10 ENCOUNTER — Other Ambulatory Visit: Payer: Self-pay

## 2023-11-10 ENCOUNTER — Emergency Department
Admission: EM | Admit: 2023-11-10 | Discharge: 2023-11-10 | Disposition: A | Discharge status: Home / Self Care | Payer: Medicaid Other | Attending: Emergency Medicine | Admitting: Emergency Medicine

## 2023-11-10 DIAGNOSIS — M545 Low back pain, unspecified: Secondary | ICD-10-CM | POA: Insufficient documentation

## 2023-11-10 LAB — POC URINE PREG, ED: Preg Test, Ur: NEGATIVE

## 2023-11-10 MED ORDER — HYDROCODONE-ACETAMINOPHEN 5-325 MG PO TABS: 1.0000 | ORAL_TABLET | Freq: Four times a day (QID) | ORAL | 0 refills | Status: DC | PRN

## 2023-11-10 MED ORDER — METHOCARBAMOL 500 MG PO TABS: 500.0000 mg | ORAL_TABLET | Freq: Four times a day (QID) | ORAL | 0 refills | Status: DC | PRN

## 2023-11-10 MED ORDER — KETOROLAC TROMETHAMINE 30 MG/ML IJ SOLN
30.0000 mg | Freq: Once | INTRAMUSCULAR | Status: AC
  Administered 2023-11-10: 30 mg via INTRAMUSCULAR
  Filled 2023-11-10: qty 1

## 2023-11-10 MED ORDER — NAPROXEN 500 MG PO TABS: 500.0000 mg | ORAL_TABLET | Freq: Two times a day (BID) | ORAL | 0 refills | Status: DC

## 2023-11-10 NOTE — ED Triage Notes (Signed)
Pt c/o back pain x3 days after feeling that she may have injured it on Monday while getting dressed. Pt denies any other medical hx besides hypertension

## 2023-11-10 NOTE — ED Provider Notes (Signed)
[facility] Provider Note    Event Date/Time   First MD Initiated Contact with Patient 11/10/23 [phone]     (approximate)   History   Back Pain   HPI  Graylynn Basse is a 32 y.o. female   presents to the ED with complaint of low back pain for approximately 3 days.  Patient states that she began having pain while getting dressed.  No prior injuries to her back.  Patient denies any urinary symptoms, incontinence of bowel or bladder or radiculopathy.  Patient has taken over-the-counter medication without any relief.      Physical Exam   Triage Vital Signs: ED Triage Vitals  Encounter Vitals Group     BP 11/10/23 0913 (!) 157/92     Systolic BP Percentile --      Diastolic BP Percentile --      Pulse Rate 11/10/23 0913 78     Resp 11/10/23 0913 16     Temp 11/10/23 0913 97.9 F (36.6 C)     Temp Source 11/10/23 0913 Oral     SpO2 11/10/23 0913 99 %     Weight 11/10/23 0914 241 lb (109.3 kg)     Height 11/10/23 0914 5\' 6"  (1.676 m)     Head Circumference --      Peak Flow --      Pain Score 11/10/23 0914 5     Pain Loc --      Pain Education --      Exclude from Growth Chart --     Most recent vital signs: Vitals:   11/10/23 0913  BP: (!) 157/92  Pulse: 78  Resp: 16  Temp: 97.9 F (36.6 C)  SpO2: 99%     General: Awake, no distress.  CV:  Good peripheral perfusion.  Resp:  Normal effort.  Abd:  No distention.  Other:  Generalized lumbar pain bilaterally around L5-S1 area.  Straight leg raises are approximately 45 degrees with pain bilaterally.  Good muscle strength at 5/5.   ED Results / Procedures / Treatments   Labs (all labs ordered are listed, but only abnormal results are displayed) Labs Reviewed  POC URINE PREG, ED - Normal     RADIOLOGY Lumbar spine x-ray images were reviewed by myself independent of the radiologist and no compression fracture is noted.  Radiology report does mention some minimal disc height loss at  L5-S1.    PROCEDURES:  Critical Care performed:   Procedures   MEDICATIONS ORDERED IN ED: Medications  ketorolac (TORADOL) 30 MG/ML injection 30 mg (30 mg Intramuscular Given 11/10/23 1010)     IMPRESSION / MDM / ASSESSMENT AND PLAN / ED COURSE  I reviewed the triage vital signs and the nursing notes.   Differential diagnosis includes, but is not limited to, lumbosacral pain, strain, compression fracture, degenerative disc disease, muscular spasms.  32 year old female presents to the ED with complaint of back pain for the last 3 days without known history of injury.  Patient states pain started abruptly while she was getting dressed and has not had any relief since.  Patient was able to drive to the emergency department and she is aware that she will not be given anything that could cause drowsiness.  Action of Toradol 30 mg IM was given while in the ED and x-rays showed a minimal loss of disc height at L5-S1 area but otherwise unremarkable.  Patient is aware that prescriptions would be sent to the pharmacy and that the  methocarbamol and hydrocodone could cause drowsiness and she is not to drive or operate machinery while taking this medication.  She has to follow-up with her PCP if any continued problems or concerns.      Patient's presentation is most consistent with acute complicated illness / injury requiring diagnostic workup.  FINAL CLINICAL IMPRESSION(S) / ED DIAGNOSES   Final diagnoses:  Acute midline low back pain without sciatica     Rx / DC Orders   ED Discharge Orders          Ordered    methocarbamol (ROBAXIN) 500 MG tablet  Every 6 hours PRN        11/10/23 1309    naproxen (NAPROSYN) 500 MG tablet  2 times daily with meals        11/10/23 1309    HYDROcodone-acetaminophen (NORCO/VICODIN) 5-325 MG tablet  Every 6 hours PRN        11/10/23 1309             Note:  This document was prepared using Dragon voice recognition software and may include  unintentional dictation errors.   Tommi Rumps, PA-C 11/10/23 1348    Sharyn Creamer, MD 11/10/23 1705

## 2023-11-10 NOTE — Discharge Instructions (Signed)
Follow-up with your primary care provider if any continued problems or concerns.  Do not drive or operate machinery while taking the hydrocodone and methocarbamol as it could cause drowsiness and increase your risk for injury.  Also use ice or heat to your back as needed for discomfort.

## 2024-06-26 ENCOUNTER — Ambulatory Visit
Admission: EM | Admit: 2024-06-26 | Discharge: 2024-06-26 | Disposition: A | Discharge status: Home / Self Care | Attending: Physician Assistant | Admitting: Physician Assistant

## 2024-06-26 DIAGNOSIS — Z113 Encounter for screening for infections with a predominantly sexual mode of transmission: Secondary | ICD-10-CM | POA: Insufficient documentation

## 2024-06-26 NOTE — ED Provider Notes (Signed)
 [facility]    CSN: [id number] Arrival date & time: 06/26/24  0932      History   Chief Complaint Chief Complaint  Patient presents with   SEXUALLY TRANSMITTED DISEASE    HPI Mary Gilbert is a 33 y.o. female presenting for asymptomatic STI screening. Patient states that her female partner told her he was having symptoms (junk coming out of his penis) and she should be checked for STIs. She denies vaginal discharge, vaginal odor, pelvic pain, abnormal bleeding, genital lesions, dysuria. Patient denies any other partners but believes her boyfriend may have other partners.  She says she went to [facility] and tested positive for Mycoplasma Genitalium. She was treated with doxycyline 1 month.  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 Authorizing Provider  norethindrone (MICRONOR) 0.35 MG tablet  05/24/24  Yes [provider]  WEGOVY 0.5 MG/0.5ML SOAJ  02/18/24  Yes [provider]  Multiple Vitamin  (MULTIVITAMIN PO) Take 1 tablet by mouth daily. Patient not taking: Reported on 02/23/2023    [provider]    Family History Family History  Problem Relation Age of Onset   Mental illness Mother    Heart disease Father    Hypertension Father     Social History Social History   Tobacco Use   Smoking status: Every Day    Current packs/day: 1.00    Types: Cigarettes   Smokeless tobacco: Never  Vaping Use   Vaping status: Never Used  Substance Use Topics   Alcohol use: No   Drug use: No     Allergies   Nefazodone and Trazodone  and nefazodone   Review of Systems Review of Systems  Constitutional:  Negative for fatigue and fever.  Gastrointestinal:  Negative for abdominal pain.  Genitourinary:  Negative for dysuria, flank pain, frequency, hematuria, urgency, vaginal bleeding, vaginal discharge and vaginal pain.  Musculoskeletal:  Negative for back pain.  Skin:  Negative for rash.     Physical Exam Triage Vital Signs ED Triage Vitals  Encounter Vitals Group     BP 06/26/24 0951 (!) 143/97     Girls Systolic BP Percentile --      Girls Diastolic BP Percentile --      Boys  Systolic BP Percentile --      Boys Diastolic BP Percentile --      Pulse Rate 06/26/24 0951 81     Resp 06/26/24 0951 16     Temp 06/26/24 0951 98.7 F (37.1 C)     Temp Source 06/26/24 0951 Oral     SpO2 06/26/24 0951 97 %     Weight --      Height --      Head Circumference --      Peak Flow --      Pain Score 06/26/24 0950 0     Pain Loc --      Pain Education --      Exclude from Growth Chart --    No data found.  Updated Vital Signs BP (!) 143/97 (BP Location: Right Arm)   Pulse 81   Temp 98.7 F (37.1 C) (Oral)   Resp 16   SpO2 97%      Physical Exam Vitals and nursing note reviewed.  Constitutional:      General: She is not in acute distress.    Appearance: Normal appearance. She is not ill-appearing or toxic-appearing.  HENT:     Head: Normocephalic and  atraumatic.   Eyes:     General: No scleral icterus.       Right eye: No discharge.        Left eye: No discharge.     Conjunctiva/sclera: Conjunctivae normal.    Cardiovascular:     Rate and Rhythm: Normal rate.  Pulmonary:     Effort: Pulmonary effort is normal. No respiratory distress.   Musculoskeletal:     Cervical back: Neck supple.   Skin:    General: Skin is dry.   Neurological:     General: No focal deficit present.     Mental Status: She is alert. Mental status is at baseline.     Motor: No weakness.     Gait: Gait normal.   Psychiatric:        Mood and Affect: Mood normal.        Behavior: Behavior normal.      UC Treatments / Results  Labs (all labs ordered are listed, but only abnormal results are displayed) Labs Reviewed  MISC [facility] TEST (SEND OUT)  CERVICOVAGINAL ANCILLARY ONLY    EKG   Radiology No results found.  Procedures Procedures (including critical care time)  Medications Ordered in UC Medications - No data to display  Initial Impression / Assessment and Plan / UC Course  I have reviewed the triage vital signs and the nursing notes.  Pertinent labs & imaging results that were available during my care of the patient were reviewed by me and considered in my medical decision making (see chart for details).   33 y/o female presents for STI screening. Denies symptoms. Possible exposure to STI.  Patient reports testing positive for mycoplasma genitalium a month ago.  Patient performs vaginal self swab to test for GC/chlamydia/trichomonas.  Also testing for mycoplasma genitalium since patient reports testing positive for this and all receiving doxycycline  x 1 week.  Advised how to assess results. Will treat if any positive results. Awaiting results since she is asymptomatic.   If positive for mycoplasma genitalium patient should receive doxycycline  100 mg twice daily x 7 days followed by azithromycin 1 g orally initial dose, followed by  500 mg orally once daily for 3 additional days for 2.5 g total.  Alternative regimen is doxycycline  100 mg  twice daily x 7 days followed by moxifloxacin 400 mg once daily x 7 days.   Final Clinical Impressions(s) / UC Diagnoses   Final diagnoses:  Routine screening for STI (sexually transmitted infection)     Discharge Instructions      -Testing for mycoplasma genitalium, gonorrhea, chlamydia, and trichomonas. Will all with any positive results.  -Results will be available in MyChart in the next few days -No sexual intercourse until results. If positive., no sexual intercourse until 1 week after you and partner (if current partner) are treated.  If you are positive for mycoplasma genitalium, your patient should be tested for this same STI and treated accordingly.     ED Prescriptions   None    PDMP not reviewed this encounter.   Arvis Jolan NOVAK, PA-C 06/26/24 1032

## 2024-06-26 NOTE — ED Triage Notes (Addendum)
 Patient is here to be tested. Patient states that her boyfriend is having sx and told her to come get checked. Unsure if boyfriend is being treated or tested. Patient states that she isnt having any sx.

## 2024-06-26 NOTE — Discharge Instructions (Addendum)
-  Testing for mycoplasma genitalium, gonorrhea, chlamydia, and trichomonas. Will all with any positive results.  -Results will be available in MyChart in the next few days -No sexual intercourse until results. If positive., no sexual intercourse until 1 week after you and partner (if current partner) are treated.  If you are positive for mycoplasma genitalium, your patient should be tested for this same STI and treated accordingly.

## 2024-06-27 LAB — CERVICOVAGINAL ANCILLARY ONLY
Chlamydia: NEGATIVE
Comment: NEGATIVE
Comment: NEGATIVE
Comment: NORMAL
Neisseria Gonorrhea: NEGATIVE
Trichomonas: NEGATIVE

## 2024-06-28 ENCOUNTER — Ambulatory Visit (HOSPITAL_COMMUNITY): Payer: Self-pay

## 2024-06-28 LAB — MISC LABCORP TEST (SEND OUT): Labcorp test code: 180076

## 2024-06-28 MED ORDER — AZITHROMYCIN 500 MG PO TABS: ORAL_TABLET | ORAL | 0 refills | Status: AC

## 2024-06-28 MED ORDER — DOXYCYCLINE HYCLATE 100 MG PO TABS
100.0000 mg | ORAL_TABLET | Freq: Two times a day (BID) | ORAL | 0 refills | Status: DC
Start: 2024-06-28 — End: 2024-06-28

## 2024-06-28 MED ORDER — DOXYCYCLINE HYCLATE 100 MG PO TABS: 100.0000 mg | ORAL_TABLET | Freq: Two times a day (BID) | ORAL | 0 refills | Status: AC

## 2024-08-04 ENCOUNTER — Encounter: Payer: Self-pay | Admitting: Emergency Medicine

## 2024-08-04 ENCOUNTER — Ambulatory Visit
Admission: EM | Admit: 2024-08-04 | Discharge: 2024-08-04 | Disposition: A | Discharge status: Home / Self Care | Attending: Internal Medicine | Admitting: Internal Medicine

## 2024-08-04 DIAGNOSIS — N898 Other specified noninflammatory disorders of vagina: Secondary | ICD-10-CM | POA: Insufficient documentation

## 2024-08-04 NOTE — ED Triage Notes (Addendum)
 Pt presents to [facility] for retesting for STDs. She was positive for mycoplasma genitalium on 06/28/24 and was treated accordingly. She states she was tested about a week ago and was negative but her boyfriend was retested and was still positive. She states they have had intercourse since he tested positive again.  She states she has a weird color discharge.

## 2024-08-04 NOTE — ED Provider Notes (Signed)
 [facility]    CSN: [id number] Arrival date & time: 08/04/24  9157      History   Chief Complaint Chief Complaint  Patient presents with   SEXUALLY TRANSMITTED DISEASE    HPI Mary Gilbert is a 33 y.o. female who presents requesting to be retested for Mycoplasma genitalium since her boyfriend's re-test is positive after treatment and she had unprotected sex one week ago and she started having abnormal vaginal discharge. She had a negative Mycoplasma genitalium test one week ago before the exposure. She declined other STD testing.     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 Authorizing Provider  Multiple Vitamin (MULTIVITAMIN PO) Take 1 tablet by mouth daily. Patient not taking: Reported on 02/23/2023    [provider]  norethindrone (MICRONOR) 0.35 MG tablet  05/24/24   [provider]  WEGOVY 0.5 MG/0.5ML [facility]  02/18/24   [provider]    Family History Family History  Problem Relation Age of  Onset   Mental illness Mother    Heart disease Father    Hypertension Father     Social History Social History   Tobacco Use   Smoking status: Every Day    Current packs/day: 1.00    Types: Cigarettes   Smokeless tobacco: Never  Vaping Use   Vaping status: Never Used  Substance Use Topics   Alcohol use: No   Drug use: No     Allergies   Nefazodone and Trazodone  and nefazodone   Review of Systems Review of Systems As noted in HPI  Physical Exam Triage Vital Signs ED Triage Vitals  Encounter Vitals Group     BP 08/04/24 0903 (!) 135/92     Girls Systolic BP Percentile --      Girls Diastolic BP Percentile --      Boys Systolic BP Percentile --      Boys Diastolic BP Percentile --      Pulse Rate 08/04/24 0903 69     Resp 08/04/24 0903 16     Temp 08/04/24 0903 98.4 F (36.9 C)     Temp Source 08/04/24 0903 Oral     SpO2 08/04/24 0903 100 %     Weight 08/04/24 0901 240 lb 15.4  oz (109.3 kg)     Height 08/04/24 0901 5' 6 (1.676 m)     Head Circumference --      Peak Flow --      Pain Score 08/04/24 0901 0     Pain Loc --      Pain Education --      Exclude from Growth Chart --    No data found.  Updated Vital Signs BP (!) 135/92 (BP Location: Left Arm)   Pulse 69   Temp 98.4 F (36.9 C) (Oral)   Resp 16   Ht 5' 6 (1.676 m)   Wt 240 lb 15.4 oz (109.3 kg)   LMP 07/10/2024 (Approximate)   SpO2 100%   BMI 38.89 kg/m   Visual Acuity Right Eye Distance:   Left Eye Distance:   Bilateral Distance:    Right Eye Near:   Left Eye Near:    Bilateral Near:     Physical Exam Vitals and nursing note reviewed.  Constitutional:      General: She is not in acute distress.    Appearance: She is not toxic-appearing.  Eyes:     Conjunctiva/sclera: Conjunctivae normal.  Pulmonary:     Effort: Pulmonary effort is normal.  Musculoskeletal:     Cervical back: Neck supple.  Skin:    General: Skin is warm and dry.  Neurological:     Mental Status: She is  alert and oriented to person, place, and time.     Gait: Gait normal.  Psychiatric:        Mood and Affect: Mood normal.        Behavior: Behavior normal.        Thought Content: Thought content normal.        Judgment: Judgment normal.      UC Treatments / Results  Labs (all labs ordered are listed, but only abnormal results are displayed) Labs Reviewed  MISC [facility] TEST (SEND OUT)    EKG   Radiology No results found.  Procedures Procedures (including critical care time)  Medications Ordered in UC Medications - No data to display  Initial Impression / Assessment and Plan / UC Course  I have reviewed the triage vital signs and the nursing notes.  Abnormal vaginal discharge and exposure to Mycoplasma Genitalium.  This test was ordered and we will inform her when the results come back.   Final Clinical Impressions(s) / UC Diagnoses   Final diagnoses:  Vaginal discharge   Discharge Instructions   None    ED Prescriptions   None    PDMP not reviewed this encounter.   Lindi Carter, PA-C 08/04/24 [phone]

## 2024-08-04 NOTE — Discharge Instructions (Addendum)
 We will inform you if your swab test comes back positive, but you may also check your mychart for results.

## 2024-08-06 LAB — MISC LABCORP TEST (SEND OUT): Labcorp test code: 180076

## 2024-08-07 ENCOUNTER — Ambulatory Visit (HOSPITAL_COMMUNITY): Payer: Self-pay

## 2024-10-30 ENCOUNTER — Telehealth

## 2024-10-30 NOTE — Patient Instructions (Incomplete)
 First Trimester of Pregnancy  The first trimester of pregnancy starts on the first day of your last monthly period until the end of week 13. This is months 1 through 3 of pregnancy. A week after a sperm fertilizes an egg, the egg will implant into the wall of the uterus and begin to develop into a baby. Body changes during your first trimester Your body goes through many changes during pregnancy. The changes usually return to normal after your baby is born. Physical changes Your breasts may grow larger and may hurt. The area around your nipples may get darker. Your periods will stop. Your hair and nails may grow faster. You may pee more often. Health changes You may tire easily. Your gums may bleed and may be sensitive when you brush and floss. You may not feel hungry. You may have heartburn. You may throw up or feel like you may throw up. You may want to eat some foods, but not others. You may have headaches. You may have trouble pooping (constipation). Other changes Your emotions may change from day to day. You may have more dreams. Follow these instructions at home: Medicines Talk to your health care provider if you're taking medicines. Ask if the medicines are safe to take during pregnancy. Your provider may change the medicines that you take. Do not take any medicines unless told to by your provider. Take a prenatal vitamin that has at least 600 micrograms (mcg) of folic acid. Do not use herbal medicines, illegal substances, or medicines that are not approved by your provider. Eating and drinking While you're pregnant your body needs extra food for your growing baby. Talk with your provider about what to eat while pregnant. Activity Most women are able to exercise during pregnancy. Exercises may need to change as your pregnancy goes on. Talk to your provider about your activities and exercise routines. Relieving pain and discomfort Wear a good, supportive bra if your breasts  hurt. Rest with your legs raised if you have leg cramps or low back pain. Safety Wear your seatbelt at all times when you're in a car. Talk to your provider if someone hits you, hurts you, or yells at you. Talk with your provider if you're feeling sad or have thoughts of hurting yourself. Lifestyle Certain things can be harmful while you're pregnant. Follow these rules: Do not use hot tubs, steam rooms, or saunas. Do not douche. Do not use tampons or scented pads. Do not drink alcohol,smoke, vape, or use products with nicotine  or tobacco in them. If you need help quitting, talk with your provider. Avoid cat litter boxes and soil used by cats. These things carry germs that can cause harm to your pregnancy and your baby. General instructions Keep all follow-up visits. It helps you and your unborn baby stay as healthy as possible. Write down your questions. Take them to your visits. Your provider will: Talk with you about your overall health. Give you advice or refer you to specialists who can help with different needs, including: Prenatal education classes. Mental health and counseling. Foods and healthy eating. Ask for help if you need help with food. Call your dentist and ask to be seen. Brush your teeth with a soft toothbrush. Floss gently. Where to find more information American Pregnancy Association: americanpregnancy.org [employer]: acog.org Office on [employer] Health: [employer]Less[employer] Contact a health care provider if: You feel dizzy, faint, or have a fever. You vomit or have watery poop (diarrhea) for 2  days or more. You have abnormal discharge or bleeding from your vagina. You have pain when you pee or your pee smells bad. You have cramps, pain, or pressure in your belly area. Get help right away if: You have trouble breathing or chest pain. You have any kind of injury, such as from a fall or a car crash. These symptoms may be an  emergency. Get help right away. Call 911. Do not wait to see if the symptoms will go away. Do not drive yourself to the hospital. This information is not intended to replace advice given to you by your health care provider. Make sure you discuss any questions you have with your health care provider. Document Revised: 09/16/2023 Document Reviewed: 04/16/2023 Elsevier Patient Education  2024 Elsevier Inc.   Commonly Asked Questions During Pregnancy  Cats: A parasite can be excreted in cat feces.  To avoid exposure you need to have another person empty the little box.  If you must empty the litter box you will need to wear gloves.  Wash your hands after handling your cat.  This parasite can also be found in raw or undercooked meat so this should also be avoided.  Colds, Sore Throats, Flu: Please check your medication sheet to see what you can take for symptoms.  If your symptoms are unrelieved by these medications please call the office.  Dental Work: Most any dental work Agricultural consultant recommends is permitted.  X-rays should only be taken during the first trimester if absolutely necessary.  Your abdomen should be shielded with a lead apron during all x-rays.  Please notify your provider prior to receiving any x-rays.  Novocaine is fine; gas is not recommended.  If your dentist requires a note from us  prior to dental work please call the office and we will provide one for you.  Exercise: Exercise is an important part of staying healthy during your pregnancy.  You may continue most exercises you were accustomed to prior to pregnancy.  Later in your pregnancy you will most likely notice you have difficulty with activities requiring balance like riding a bicycle.  It is important that you listen to your body and avoid activities that put you at a higher risk of falling.  Adequate rest and staying well hydrated are a must!  If you have questions about the safety of specific activities ask your provider.     Exposure to Children with illness: Try to avoid obvious exposure; report any symptoms to us  when noted,  If you have chicken pos, red measles or mumps, you should be immune to these diseases.   Please do not take any vaccines while pregnant unless you have checked with your OB provider.  Fetal Movement: After 28 weeks we recommend you do kick counts twice daily.  Lie or sit down in a calm quiet environment and count your baby movements kicks.  You should feel your baby at least 10 times per hour.  If you have not felt 10 kicks within the first hour get up, walk around and have something sweet to eat or drink then repeat for an additional hour.  If count remains less than 10 per hour notify your provider.  Fumigating: Follow your pest control agent's advice as to how long to stay out of your home.  Ventilate the area well before re-entering.  Hemorrhoids:   Most over-the-counter preparations can be used during pregnancy.  Check your medication to see what is safe to use.  It is important  to use a stool softener or fiber in your diet and to drink lots of liquids.  If hemorrhoids seem to be getting worse please call the office.   Hot Tubs:  Hot tubs Jacuzzis and saunas are not recommended while pregnant.  These increase your internal body temperature and should be avoided.  Intercourse:  Sexual intercourse is safe during pregnancy as long as you are comfortable, unless otherwise advised by your provider.  Spotting may occur after intercourse; report any bright red bleeding that is heavier than spotting.  Labor:  If you know that you are in labor, please go to the hospital.  If you are unsure, please call the office and let us  help you decide what to do.  Lifting, straining, etc:  If your job requires heavy lifting or straining please check with your provider for any limitations.  Generally, you should not lift items heavier than that you can lift simply with your hands and arms (no back  muscles)  Painting:  Paint fumes do not harm your pregnancy, but may make you ill and should be avoided if possible.  Latex or water  based paints have less odor than oils.  Use adequate ventilation while painting.  Permanents & Hair Color:  Chemicals in hair dyes are not recommended as they cause increase hair dryness which can increase hair loss during pregnancy.   Highlighting and permanents are allowed.  Dye may be absorbed differently and permanents may not hold as well during pregnancy.  Sunbathing:  Use a sunscreen, as skin burns easily during pregnancy.  Drink plenty of fluids; avoid over heating.  Tanning Beds:  Because their possible side effects are still unknown, tanning beds are not recommended.  Ultrasound Scans:  Routine ultrasounds are performed at approximately 20 weeks.  You will be able to see your baby's general anatomy an if you would like to know the gender this can usually be determined as well.  If it is questionable when you conceived you may also receive an ultrasound early in your pregnancy for dating purposes.  Otherwise ultrasound exams are not routinely performed unless there is a medical necessity.  Although you can request a scan we ask that you pay for it when conducted because insurance does not cover  patient request scans.  Work: If your pregnancy proceeds without complications you may work until your due date, unless your physician or employer advises otherwise.  Round Ligament Pain/Pelvic Discomfort:  Sharp, shooting pains not associated with bleeding are fairly common, usually occurring in the second trimester of pregnancy.  They tend to be worse when standing up or when you remain standing for long periods of time.  These are the result of pressure of certain pelvic ligaments called round ligaments.  Rest, Tylenol  and heat seem to be the most effective relief.  As the womb and fetus grow, they rise out of the pelvis and the discomfort improves.  Please  notify the office if your pain seems different than that described.  It may represent a more serious condition.  Common Medications Safe in Pregnancy  Acne:      Constipation:  Benzoyl Peroxide     Colace  Clindamycin      Dulcolax Suppository  Topica Erythromycin     Fibercon  Salicylic Acid      Metamucil         Miralax AVOID:        Senakot   Accutane    Cough:  Retin-A  Cough Drops  Tetracycline      Phenergan w/ Codeine if Rx  Minocycline      Robitussin (Plain & DM)  Antibiotics:     Crabs/Lice:  Ceclor       RID  Cephalosporins    AVOID:  E-Mycins      Kwell  Keflex  Macrobid/Macrodantin   Diarrhea:  Penicillin      Kao-Pectate  Zithromax      Imodium AD         PUSH FLUIDS AVOID:       Cipro     Fever:  Tetracycline      Tylenol  (Regular or Extra  Minocycline       Strength)  Levaquin      Extra Strength-Do not          Exceed 8 tabs/24 hrs Caffeine:        200mg /day (equiv. To 1 cup of coffee or  approx. 3 12 oz sodas)         Gas: Cold/Hayfever:       Gas-X  Benadryl       Mylicon  Claritin       Phazyme  **Claritin-D        Chlor-Trimeton    Headaches:  Dimetapp      ASA-Free Excedrin  Drixoral-Non-Drowsy     Cold Compress  Mucinex (Guaifenasin)     Tylenol  (Regular or Extra  Sudafed/Sudafed-12 Hour     Strength)  **Sudafed PE Pseudoephedrine   Tylenol  Cold & Sinus     Vicks Vapor Rub  Zyrtec  **AVOID if Problems With Blood Pressure         Heartburn: Avoid lying down for at least 1 hour after meals  Aciphex      Maalox     Rash:  Milk of Magnesia     Benadryl     Mylanta       1% Hydrocortisone Cream  Pepcid   Pepcid  Complete   Sleep Aids:  Prevacid      Ambien    Prilosec       Benadryl   Rolaids       Chamomile Tea  Tums (Limit 4/day)     Unisom         Tylenol  PM         Warm milk-add vanilla or  Hemorrhoids:       Sugar for taste  Anusol/Anusol H.C.  (RX: Analapram 2.5%)  Sugar Substitutes:  Hydrocortisone OTC     Ok in  moderation  Preparation H      Tucks        Vaseline lotion applied to tissue with wiping    Herpes:     Throat:  Acyclovir      Oragel  Famvir  Valtrex     Vaccines:         Flu Shot Leg Cramps:       *Gardasil  Benadryl       Hepatitis A         Hepatitis B Nasal Spray:       Pneumovax  Saline Nasal Spray     Polio Booster         Tetanus Nausea:       Tuberculosis test or PPD  Vitamin B6 25 mg TID   AVOID:    Dramamine      *Gardasil  Emetrol       Live Poliovirus  Ginger Root 250 mg QID    MMR (measles, mumps &  High Complex Carbs @ Bedtime    rebella)  Sea Bands-Accupressure    Varicella (Chickenpox)  Unisom 1/2 tab TID     *No known complications           If received before Pain:         Known pregnancy;   Darvocet       Resume series after  Lortab        Delivery  Percocet    Yeast:   Tramadol      Femstat  Tylenol  3      Gyne-lotrimin  Ultram       Monistat  Vicodin           MISC:         All Sunscreens           Hair Coloring/highlights          Insect Repellant's          (Including DEET)         Mystic Tans  Tests and Screening During Pregnancy Tests and screenings during pregnancy are an important part of your prenatal care. These tests help your health care provider find any problems that might affect your pregnancy. Some tests need to be done for all pregnant people, and some are optional. Most of the tests and screenings do not pose any risks for you or your baby. You may need more testing if a test result shows there is a risk to your health or your baby's health. Tests and screenings done early in pregnancy Some tests and screenings you may have in early pregnancy are: Blood tests, such as: Complete blood count (CBC). Blood typing. Tests to check for diseases that can cause birth defects or can be passed to your baby, such as: [place] measles (rubella( and chicken pox. Hepatitis B and C. Human Immunodeficiency Virus (HIV). Syphilis. Zika  virus. Pee tests. Blood pressure. Testing for sexually transmitted infections (STIs), such as chlamydia or gonorrhea. Testing for tuberculosis. Ultrasound. Tests and screenings done later in pregnancy Some common tests you can expect to have later in pregnancy include: Rh antibody testing. Pee and blood tests. Glucose screening. This checks your blood sugar. It will show whether you are developing the type of diabetes that happens during pregnancy, called gestational diabetes. You may have this screening earlier if you have risk factors for diabetes. Ultrasound. This may be repeated at 16-20 weeks to check how your baby is growing. Screening for group B streptococcus (GBS). GBS is a type of bacteria that may live in your rectum or vagina. GBS can spread to your baby during birth. This test is done at 35-37 weeks of pregnancy. Non-stress test. This may be done more often if your pregnancy is high risk. Biophysical profile. This test includes ultrasound imaging and a non-stress test to check to see if your baby is healthy. This test may help decide when your baby should be born. Screening for birth defects Early in your pregnancy, tests can be done to find out if your baby is at risk for a genetic disorder. This testing is optional. The type of testing recommended for you will depend on your family and medical history, your ethnicity, and your age. Testing may include: Screening tests such as ultrasound, blood tests, or a combination of both. Carrier screening. If genetic screening shows that your baby is at risk for a genetic defect, diagnostic testing may be recommended, such as: Amniocentesis. Chorionic villus sampling. Unlike other tests done  during pregnancy, diagnostic testing does have some risk for your pregnancy. Talk to your provider about the risks and benefits of genetic testing. Questions to ask your health care provider What tests are recommended for me? When and how will these  tests be done? When will I get the results of the tests? What do the results of these tests mean for me or my baby? Do you recommend any genetic screening tests? Which ones? Should I see a genetic counselor before having genetic screening? Where to find more information Go to americanpregnancy.org Click on search. Type 'prenatal tests in the search box. Go to [employer]Less[employer].ca Click on search. Type 'prenatal tests in the search box. Go to acog.org Click on search. Type routine tests in the search box. This information is not intended to replace advice given to you by your health care provider. Make sure you discuss any questions you have with your health care provider. Document Revised: 10/12/2023 Document Reviewed: 10/12/2023 Elsevier Patient Education  2025 [employer].

## 2024-10-30 NOTE — Progress Notes (Deleted)
 New OB Intake  I connected with  Mary Gilbert on 10/30/24 at 10:15 AM EST by {Contact:24193} Video Visit and verified that I am speaking with the correct person using two identifiers. Nurse is located at [facility] and pt is located at [facility]  I discussed the limitations, risks, security and privacy concerns of performing an evaluation and management service by telephone and the availability of in person appointments. I also discussed with the patient that there may be a patient responsible charge related to this service. The patient expressed understanding and agreed to proceed.  I explained I am completing New OB Intake today. We discussed her EDD of *** that is based on LMP of ***. Pt is G***/P***. I reviewed her allergies, medications, Medical/Surgical/OB history, and appropriate screenings. There are cats in the home: {yes/no:63}. If yes: {Desc; indoor/outdoor:13239}. Based on history, this is a/an pregnancy {Complicated/Uncomplicated Pregnancy:20185} . Her obstetrical history is significant for {ob risk factors:10154}.  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  Anatomy US  Explained first scheduled US  will be ***. Anatomy US  will be scheduled around [redacted] weeks gestational age.  Labs Discussed genetic screening with patient. Patient *** genetic testing to be drawn at new OB visit. Discussed possible labs to be drawn at new OB  appointment.  COVID Vaccine Patient {HAS/HAS NOT:20194} had COVID vaccine.   Social Determinants of Health Food Insecurity: {qnni7:72521} WIC Referral: Patient {ACTION; IS/IS WNU:78978602} interested in referral to [facility].  Transportation: {transportation:25542} Childcare: Discussed no children allowed at ultrasound appointments.   First visit review I reviewed new OB appt with pt. I explained she will have blood work and pap smear/pelvic exam if indicated. Explained pt will be seen by *** at first visit; encounter routed to appropriate provider.   Mary Gilbert, CMA 10/30/2024  10:00 AM

## 2024-10-31 ENCOUNTER — Telehealth: Payer: Self-pay | Admitting: Certified Nurse Midwife

## 2024-10-31 NOTE — Telephone Encounter (Signed)
 Reached out to pt to reschedule NOB Nurse Intake appt that was scheduled on 10/30/2024 at 10:15.  Left message for pt to call back to reschedule.

## 2024-11-01 ENCOUNTER — Encounter: Payer: Self-pay | Admitting: Certified Nurse Midwife

## 2024-11-01 NOTE — Telephone Encounter (Signed)
 Reached out to pt (2x) to reschedule NOB Nurse Intake appt that was scheduled on 11/3/205 at 10:15.  Left message for pt to call back to reschedule.  Will send a MyChart letter to pt.

## 2024-11-20 ENCOUNTER — Telehealth: Payer: Self-pay | Admitting: Certified Nurse Midwife

## 2024-11-20 ENCOUNTER — Other Ambulatory Visit

## 2024-11-20 NOTE — Telephone Encounter (Signed)
 Reached out to pt to reschedule dating scan that was scheduled on 11/20/2024 at 9:15.  Left message for pt to call back to reschedule.

## 2024-11-21 ENCOUNTER — Encounter: Payer: Self-pay | Admitting: Certified Nurse Midwife

## 2024-11-21 NOTE — Telephone Encounter (Signed)
 Reached out to pt (2x) to reschedule dating scan that was scheduled on 11/20/2024 at 9:15.  Left message for pt to call back to reschedule.  Will send a MyChart letter to pt.

## 2024-11-28 NOTE — ED Provider Notes (Signed)
 ED Progress Note  Briefly, this is a G61P7 33 year old female with a past medical history of HTN, PCOS, uterine fibroids, and multiple miscarriages who presented to the ED with vaginal bleeding and pelvic cramping in the setting of pregnancy with recent ultrasound concerning for nonviable pregnancy. Pelvic exam performed by the previous provider revealed brisk bleeding and retained products of conception, which were removed. Her work-up was notable for leukocytosis to 19.1 and hCG was 711.9. Otherwise, labs were unremarkable. TVUS revealed lower uterine segment and endocervical canal with heterogeneously echoic contents. The patient was about to be discharged with OBGYN follow-up around 9:30 PM when she syncopized in her ED room. Her EKG at the time was normal but her repeat CBC demonstrated a Hgb drop from 14.9 to 13.2. She was treated with 1L IV fluids, pending repeat CBC at 1:30 PM and reassessment.    Repeat CBC at 11.7, down from 13.2.  However, patient has received IV fluid hydration, with some dilutional component.  Upon my evaluation, patient is in no acute distress, requesting to leave.  Reports significant improvement in vaginal bleeding with near cessation of bleeding since ED arrival.  Abdominal examination is benign without rebound, guarding, rigidity.  Denies further episodes of lightheadedness, diaphoresis, and continues to deny chest pain, shortness of breath.  Patient ambulating with steady gait.  Repeat blood pressure with consistent MAP.  Strict return precaution discussed with patient, who conveys understanding.   Documentation assistance was provided by Therisa Mulders, Scribe on November 29, 2024 at 12:02 AM for Norleen Cramp, MD.

## 2024-12-01 NOTE — Progress Notes (Deleted)
 NEW OB HISTORY AND PHYSICAL  SUBJECTIVE:       Mary Gilbert is a 33 y.o. G107P0020 female, No LMP recorded. (Menstrual status: Oral contraceptives)., Estimated Date of Delivery: None noted., Unknown, presents today for establishment of Prenatal Care. She reports {:313260}   Social history Partner/Relationship: Living situation: Work: Exercise: Substance use:  Indications for ASA therapy (per uptodate) One of the following: Previous pregnancy with preeclampsia, especially early onset and with an adverse outcome {yes/no:20286} Multifetal gestation {yes/no:20286} Chronic hypertension {yes/no:20286} Type 1 or 2 diabetes mellitus {yes/no:20286} Chronic kidney disease {yes/no:20286} Autoimmune disease (antiphospholipid syndrome, systemic lupus erythematosus) {yes/no:20286}  Two or more of the following: Nulliparity {yes/no:20286} Obesity (body mass index >30 kg/m2) {yes/no:20286} Family history of preeclampsia in mother or sister {yes/no:20286} Age >=35 years {yes/no:20286} Sociodemographic characteristics (African American race, low socioeconomic level) {yes/no:20286} Personal risk factors (eg, previous pregnancy with low birth weight or small for gestational age infant, previous adverse pregnancy outcome [eg, stillbirth], interval >10 years between pregnancies) {yes/no:20286}  Indications for early GDM screening  First-degree relative with diabetes {yes/no:20286} BMI >30kg/m2 {yes/no:20286} Age > 35 {yes/no:20286} Previous birth of an infant weighing >=4000 g {yes/no:20286} Gestational diabetes mellitus in a previous pregnancy {yes/no:20286} Glycated hemoglobin >=5.7 percent (39 mmol/mol), impaired glucose tolerance, or impaired fasting glucose on previous testing {yes/no:20286} High-risk race/ethnicity (eg, African American, Latino, Native American, Asian American, Pacific Islander) {yes/no:20286} Previous stillbirth of unknown cause {yes/no:20286} Maternal birthweight > 9 lbs  {yes/no:20286} History of cardiovascular disease {yes/no:20286} Hypertension or on therapy for hypertension {yes/no:20286} High-density lipoprotein cholesterol level <35 mg/dL (9.09 mmol/L) and/or a triglyceride level >250 mg/dL (7.17 mmol/L) {bzd/wn:79713} Polycystic ovary syndrome {yes/no:20286} Physical inactivity {yes/no:20286} Other clinical condition associated with insulin resistance (eg, severe obesity, acanthosis nigricans) {yes/no:20286} Current use of glucocorticoids {yes/no:20286}   Early screening tests: FBS, A1C, Random CBG, glucose challenge  Gynecologic History No LMP recorded. (Menstrual status: Oral contraceptives). {Blank multiple:19196::Normal,Abnormal,Unknown} Contraception: {method:5051} Last Pap:     Component Value Date/Time   DIAGPAP (A) 08/22/2019 0000    LOW GRADE SQUAMOUS INTRAEPITHELIAL LESION: CIN-1/ HPV (LSIL).   ADEQPAP (A) 08/22/2019 0000    Satisfactory for evaluation  endocervical/transformation zone component PRESENT.  SABRA Results were: {norm/abn:16337}  Obstetric History OB History  Gravida Para Term Preterm AB Living  3 0 0 0 2   SAB IAB Ectopic Multiple Live Births  2 0 0 1     # Outcome Date GA Lbr Len/2nd Weight Sex Type Anes PTL Lv  3 SAB           2 SAB           1A Gravida           1B Gravida             Past Medical History:  Diagnosis Date   Hypertension    Kidney stones    UTI (urinary tract infection)     Past Surgical History:  Procedure Laterality Date   CHOLECYSTECTOMY     LITHOTRIPSY      Current Outpatient Medications on File Prior to Visit  Medication Sig Dispense Refill   norethindrone (MICRONOR) 0.35 MG tablet      WEGOVY 0.5 MG/0.5ML SOAJ      No current facility-administered medications on file prior to visit.    Allergies  Allergen Reactions   Nefazodone     Other reaction(s): Other (See Comments) Headache, abdominal pain   Trazodone  And Nefazodone     Headache, abdominal pain  Social  History   Socioeconomic History   Marital status: Single    Spouse name: Not on file   Number of children: Not on file   Years of education: Not on file   Highest education level: Not on file  Occupational History   Not on file  Tobacco Use   Smoking status: Every Day    Current packs/day: 1.00    Types: Cigarettes   Smokeless tobacco: Never  Vaping Use   Vaping status: Never Used  Substance and Sexual Activity   Alcohol use: No   Drug use: No   Sexual activity: Yes    Birth control/protection: None  Other Topics Concern   Not on file  Social History Narrative   ** Merged History Encounter **       Social Drivers of Health   Financial Resource Strain: Low Risk (10/20/2024)   Received from [facility]   Overall Financial Resource Strain (CARDIA)    How hard is it for you to pay for the very basics like food, housing, medical care, and heating?: Not hard at all  Food Insecurity: No Food Insecurity (10/20/2024)   Received from [facility]   Hunger Vital Sign    Within the past 12 months, you worried that your food would run out before you got the money to buy more.: Never true    Within the past 12 months, the food you bought just didn't last and you didn't have money to get more.: Never true  Transportation Needs: No Transportation Needs (10/20/2024)   Received from [facility] - Transportation    Lack of Transportation (Medical): No    Lack of Transportation (Non-Medical): No  Physical Activity: Not on file  Stress: Not on file  Social Connections: Not on file  Intimate Partner Violence: Not on file    Family History  Problem Relation Age of Onset   Mental illness Mother    Heart disease Father    Hypertension Father     The following portions of the patient's history were reviewed and updated as appropriate: allergies, current medications, past OB history, past medical history, past surgical history, past family history, past social  history, and problem list.  Constitutional: Denied constitutional symptoms, night sweats, recent illness, fatigue, fever, insomnia and weight loss.  Eyes: Denied eye symptoms, eye pain, photophobia, vision change and visual disturbance.  Ears/Nose/Throat/Neck: Denied ear, nose, throat or neck symptoms, hearing loss, nasal discharge, sinus congestion and sore throat.  Cardiovascular: Denied cardiovascular symptoms, arrhythmia, chest pain/pressure, edema, exercise intolerance, orthopnea and palpitations.  Respiratory: Denied pulmonary symptoms, asthma, pleuritic pain, productive sputum, cough, dyspnea and wheezing.  Gastrointestinal: Denied gastro-esophageal reflux, melena, nausea and vomiting.  Genitourinary:*** Denied genitourinary symptoms including symptomatic vaginal discharge, pelvic relaxation issues, and urinary complaints.  Musculoskeletal: Denied musculoskeletal symptoms, stiffness, swelling, muscle weakness and myalgia.  Dermatologic: Denied dermatology symptoms, rash and scar.  Neurologic: Denied neurology symptoms, dizziness, headache, neck pain and syncope.  Psychiatric: Denied psychiatric symptoms, anxiety and depression.  Endocrine: Denied endocrine symptoms including hot flashes and night sweats.     OBJECTIVE: Initial Physical Exam (New OB) There were no vitals taken for this visit. Physical Exam     ASSESSMENT: {pregnancy state assessment:313271::Normal pregnancy}   PLAN: Routine prenatal care. We discussed an overview of prenatal care and when to call. Reviewed diet, exercise, and weight gain recommendations in pregnancy. Discussed benefits of breastfeeding and lactation resources at [facility]. I reviewed labs and  answered all questions.  There are no diagnoses linked to this encounter.  Rollo JINNY Maxin, CMA

## 2024-12-04 ENCOUNTER — Encounter: Payer: Self-pay | Admitting: Certified Nurse Midwife
